# Patient Record
Sex: Female | Born: 1970 | Race: White | Hispanic: No | Marital: Married | Smoking: Never smoker
Health system: Southern US, Community
[De-identification: ages and names within clinical notes are randomized; demographics above are authoritative.]

## PROBLEM LIST (undated history)

## (undated) DIAGNOSIS — K219 Gastro-esophageal reflux disease without esophagitis: Secondary | ICD-10-CM

## (undated) DIAGNOSIS — F419 Anxiety disorder, unspecified: Secondary | ICD-10-CM

## (undated) DIAGNOSIS — F32A Depression, unspecified: Secondary | ICD-10-CM

## (undated) DIAGNOSIS — E782 Mixed hyperlipidemia: Secondary | ICD-10-CM

## (undated) DIAGNOSIS — I10 Essential (primary) hypertension: Secondary | ICD-10-CM

## (undated) DIAGNOSIS — R918 Other nonspecific abnormal finding of lung field: Secondary | ICD-10-CM

## (undated) DIAGNOSIS — K589 Irritable bowel syndrome without diarrhea: Secondary | ICD-10-CM

## (undated) DIAGNOSIS — F329 Major depressive disorder, single episode, unspecified: Secondary | ICD-10-CM

## (undated) DIAGNOSIS — M1990 Unspecified osteoarthritis, unspecified site: Secondary | ICD-10-CM

## (undated) DIAGNOSIS — E785 Hyperlipidemia, unspecified: Secondary | ICD-10-CM

## (undated) DIAGNOSIS — Z8249 Family history of ischemic heart disease and other diseases of the circulatory system: Secondary | ICD-10-CM

## (undated) DIAGNOSIS — O4400 Placenta previa specified as without hemorrhage, unspecified trimester: Secondary | ICD-10-CM

## (undated) HISTORY — DX: Gastro-esophageal reflux disease without esophagitis: K21.9

## (undated) HISTORY — DX: Family history of ischemic heart disease and other diseases of the circulatory system: Z82.49

## (undated) HISTORY — DX: Complete placenta previa nos or without hemorrhage, unspecified trimester: O44.00

## (undated) HISTORY — DX: Depression, unspecified: F32.A

## (undated) HISTORY — DX: Anxiety disorder, unspecified: F41.9

## (undated) HISTORY — DX: Other nonspecific abnormal finding of lung field: R91.8

## (undated) HISTORY — DX: Essential (primary) hypertension: I10

## (undated) HISTORY — DX: Irritable bowel syndrome, unspecified: K58.9

## (undated) HISTORY — DX: Unspecified osteoarthritis, unspecified site: M19.90

## (undated) HISTORY — DX: Mixed hyperlipidemia: E78.2

## (undated) HISTORY — DX: Hyperlipidemia, unspecified: E78.5

## (undated) HISTORY — DX: Morbid (severe) obesity due to excess calories: E66.01

---
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
## 1999-05-18 ENCOUNTER — Other Ambulatory Visit: Admission: RE | Admit: 1999-05-18 | Discharge: 1999-05-18 | Payer: Self-pay | Admitting: Obstetrics and Gynecology

## 1999-09-06 ENCOUNTER — Other Ambulatory Visit: Admission: RE | Admit: 1999-09-06 | Discharge: 1999-09-06 | Payer: Self-pay | Admitting: Obstetrics & Gynecology

## 2000-03-14 ENCOUNTER — Inpatient Hospital Stay (HOSPITAL_COMMUNITY): Admission: AD | Admit: 2000-03-14 | Discharge: 2000-03-16 | Payer: Self-pay | Admitting: Obstetrics & Gynecology

## 2000-04-12 ENCOUNTER — Other Ambulatory Visit: Admission: RE | Admit: 2000-04-12 | Discharge: 2000-04-12 | Payer: Self-pay | Admitting: Obstetrics & Gynecology

## 2001-07-20 ENCOUNTER — Other Ambulatory Visit: Admission: RE | Admit: 2001-07-20 | Discharge: 2001-07-20 | Payer: Self-pay | Admitting: Obstetrics & Gynecology

## 2002-06-13 ENCOUNTER — Other Ambulatory Visit: Admission: RE | Admit: 2002-06-13 | Discharge: 2002-06-13 | Payer: Self-pay | Admitting: Obstetrics & Gynecology

## 2002-06-28 HISTORY — PX: DILATION AND CURETTAGE OF UTERUS: SHX78

## 2002-07-06 ENCOUNTER — Ambulatory Visit (HOSPITAL_COMMUNITY): Admission: RE | Admit: 2002-07-06 | Discharge: 2002-07-06 | Payer: Self-pay | Admitting: Obstetrics & Gynecology

## 2002-07-06 ENCOUNTER — Encounter (INDEPENDENT_AMBULATORY_CARE_PROVIDER_SITE_OTHER): Payer: Self-pay | Admitting: Specialist

## 2003-06-18 ENCOUNTER — Other Ambulatory Visit: Admission: RE | Admit: 2003-06-18 | Discharge: 2003-06-18 | Payer: Self-pay | Admitting: Obstetrics & Gynecology

## 2003-10-05 ENCOUNTER — Inpatient Hospital Stay (HOSPITAL_COMMUNITY): Admission: AD | Admit: 2003-10-05 | Discharge: 2003-10-15 | Payer: Self-pay | Admitting: Obstetrics & Gynecology

## 2003-11-01 ENCOUNTER — Inpatient Hospital Stay (HOSPITAL_COMMUNITY): Admission: AD | Admit: 2003-11-01 | Discharge: 2003-11-01 | Payer: Self-pay | Admitting: Obstetrics & Gynecology

## 2003-12-22 ENCOUNTER — Inpatient Hospital Stay (HOSPITAL_COMMUNITY): Admission: AD | Admit: 2003-12-22 | Discharge: 2003-12-26 | Payer: Self-pay | Admitting: *Deleted

## 2003-12-24 ENCOUNTER — Encounter (INDEPENDENT_AMBULATORY_CARE_PROVIDER_SITE_OTHER): Payer: Self-pay | Admitting: Specialist

## 2003-12-27 ENCOUNTER — Encounter: Admission: RE | Admit: 2003-12-27 | Discharge: 2004-01-26 | Payer: Self-pay | Admitting: Obstetrics & Gynecology

## 2004-01-30 ENCOUNTER — Other Ambulatory Visit: Admission: RE | Admit: 2004-01-30 | Discharge: 2004-01-30 | Payer: Self-pay | Admitting: Obstetrics & Gynecology

## 2004-12-07 ENCOUNTER — Ambulatory Visit: Payer: Self-pay | Admitting: Family Medicine

## 2005-01-02 IMAGING — US US OB COMP +14 WK
1 series · 17 of 28 positions shown · non-contrast
Comparison: none

CLINICAL DATA: 24 weeks estimated gestational age with a history of placenta previa and vaginal bleeding

[Series 1: us ob comp +14 wk · 17 of 58 slices shown]
[im 1/58]
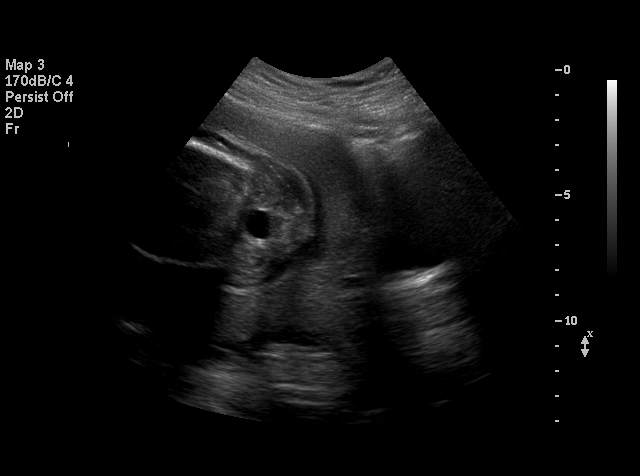
[im 5/58]
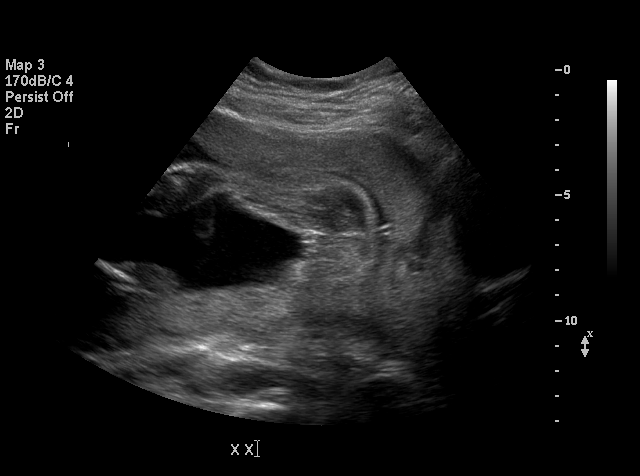
[im 9/58]
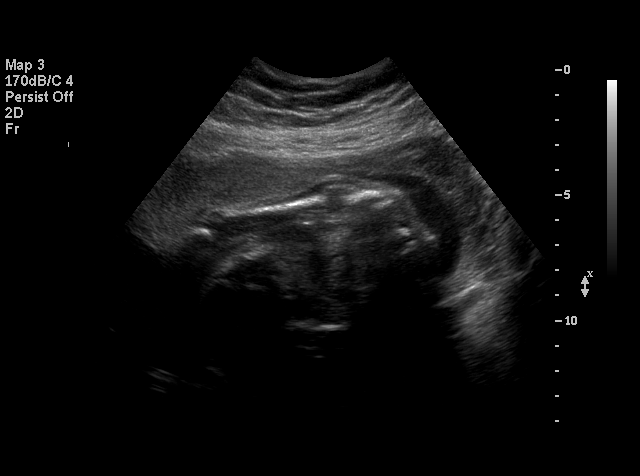
[im 11/58]
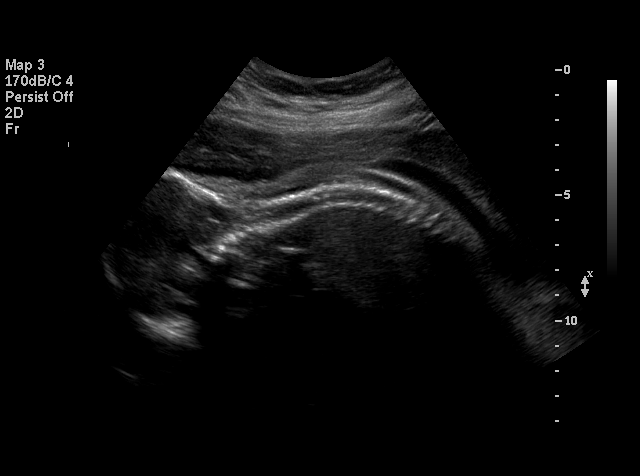
[im 15/58]
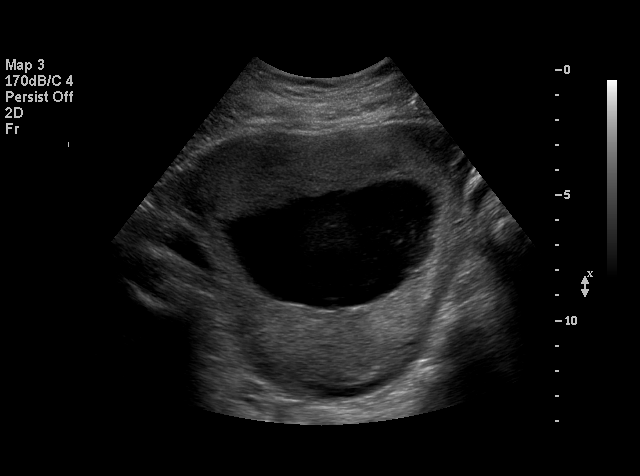
[im 20/58]
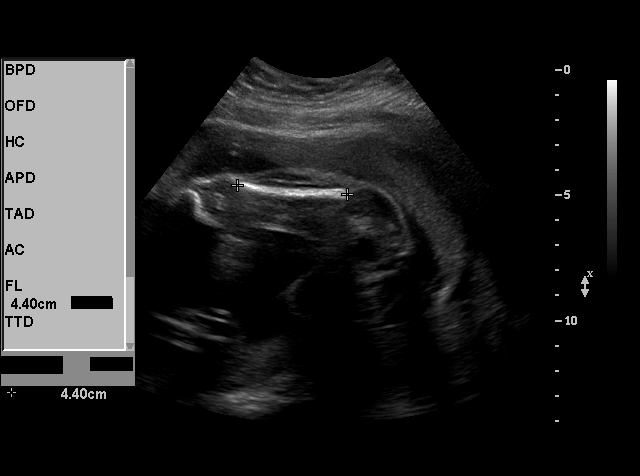
[im 22/58]
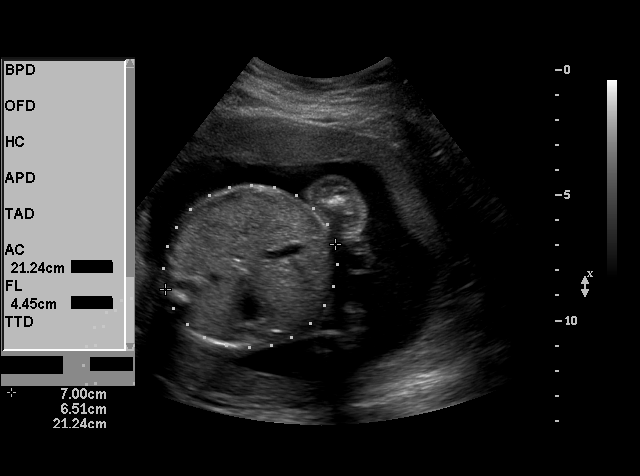
[im 26/58]
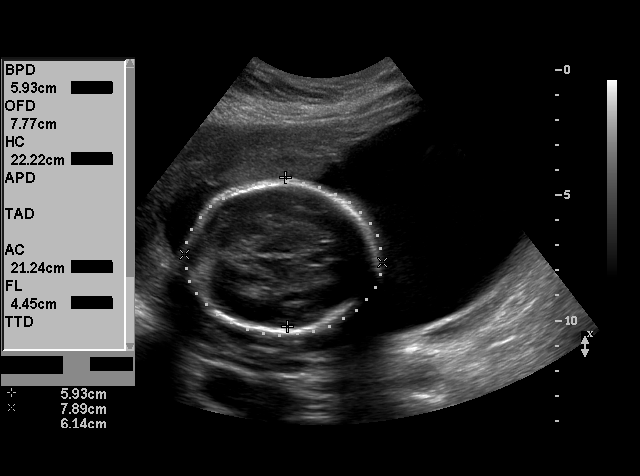
[im 30/58]
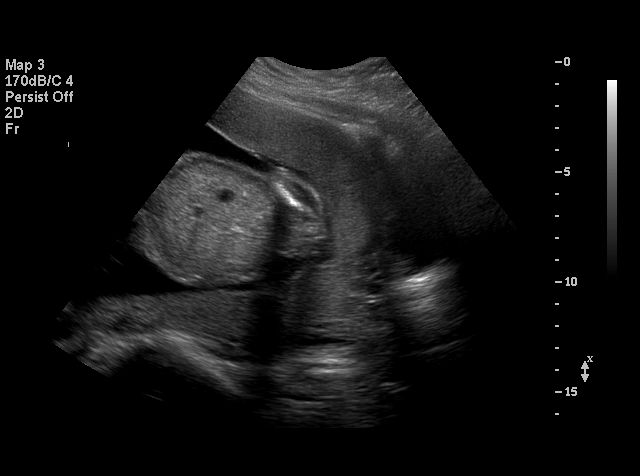
[im 32/58]
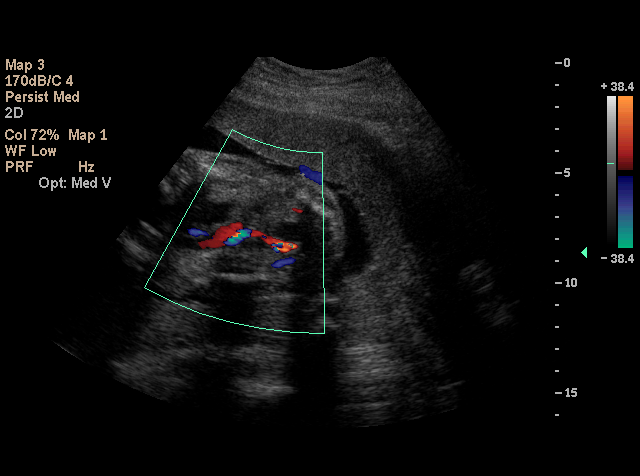
[im 36/58]
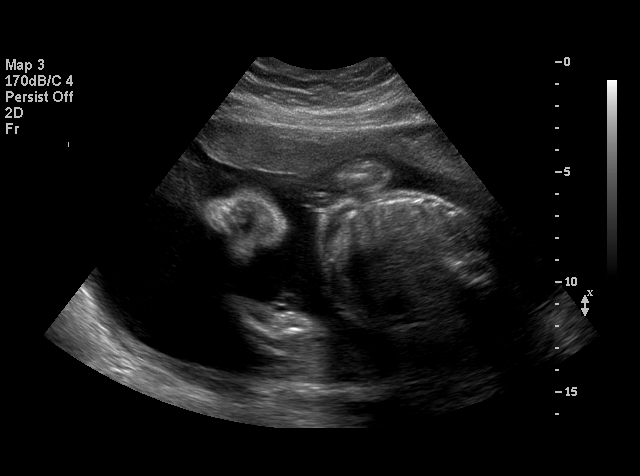
[im 39/58]
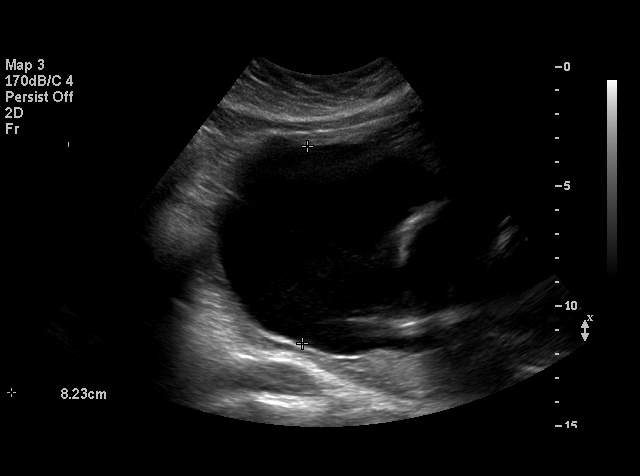
[im 43/58]
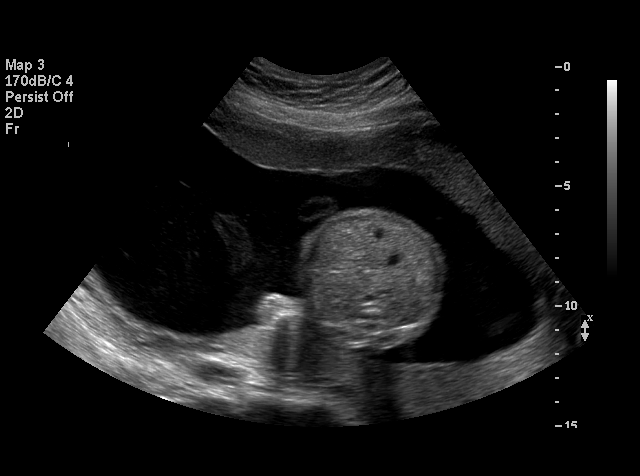
[im 47/58]
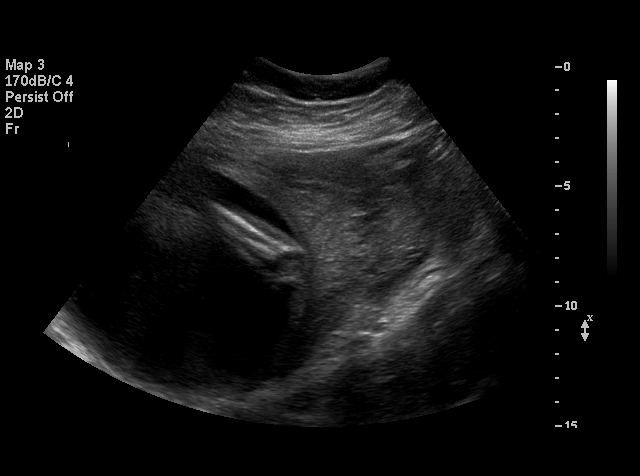
[im 49/58]
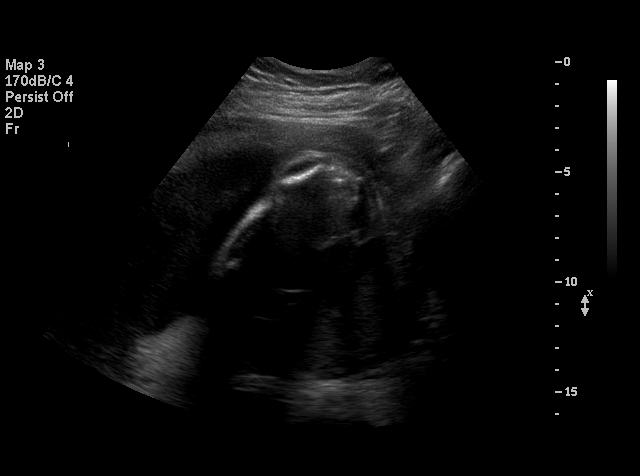
[im 53/58]
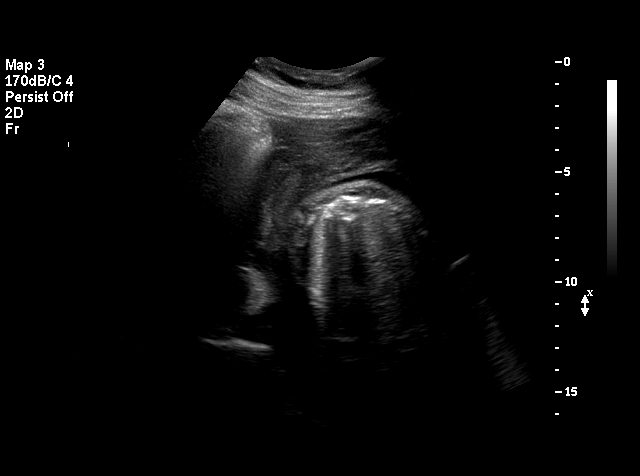
[im 58/58]
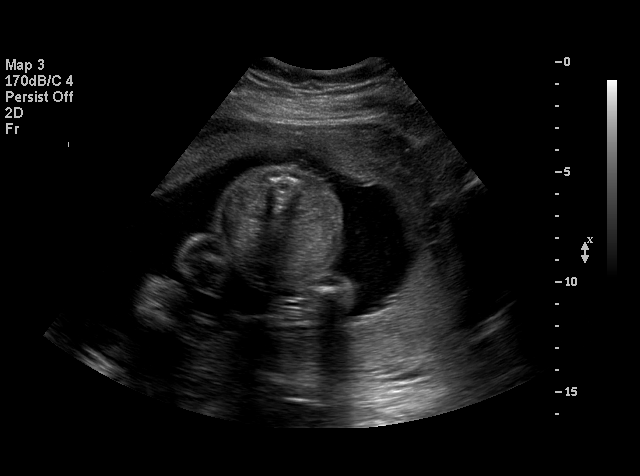

[17 of 28 positions shown; findings below may reference images not displayed]

OBSTETRICAL ULTRASOUND

NUMBER OF FETUSES:  1
HEART RATE: 162
MOVEMENT:  Yes
BREATHING:  Yes  
PRESENTATION:  Breech
PLACENTAL LOCATION:  Anterior and posterior
GRADE:  I
PREVIA:  Complete
AMNIOTIC FLUID (SUBJECTIVE):  Normal.
AMNIOTIC FLUID (OBJECTIVE):   8.2 cm vertical pocket
FETAL BIOMETRY
BPD:  5.9 cm  24W  1D
HC:  22.3 cm  24W  3D
AC:  21.3 cm  25W  6D  
FL:  4.4 cm  24W  5D
MEAN GA:      24W  6D
Fetal indices are within normal limits.

FETAL ANATOMY
LATERAL VENTRICLES:  visualized   
THALAMI/CSP:    visualized     
POSTERIOR FOSSA:  visualized   
NUCHAL REGION:  N/A  
SPINE:  visualized except for longitudinal views of the thoracic spine  
4 CHAMBER HEART ON LEFT:  visualized     
STOMACH ON LEFT:  visualized   
3 VESSEL CORD:  2 vessel cord  
CORD INSERTION SITE:  visualized   
KIDNEYS:  visualized   
BLADDER:  visualized   
EXTREMITIES:  visualized     

ADDITIONAL ANATOMY VISUALIZED:  RVOT, upper lip, orbits, profile, diaphragm, heel, ductal arch.
COMMENT:  The sonographer noted at her real time exam that there was a membrane located in the region of the fundus of the uterus.  It was unclear at her real time exam whether this represented a synechiae or possibly an amniotic band.  
MATERNAL FINDINGS
CERVIX: 
3.4 cm transabdominally.
IMPRESSION 

single intrauterine pregnancy demonstrating an estimated gestational age by ultrasound of 24 weeks 6 days.  Correlatin with assigned gestational age by LMP of 24 weeks 1 day suggests appropriate growth.
Subjectively and quantitatively normal amniotic fluid volume.
Current symmetric complete placenta previa.  No evidence for retroplacental, preplacental, or marginal hemorrhage is seen in this patient with a history of vaginal bleeding.
A two-vessel cord is felt to be present.  No other focal fetal abnormalities are noted on the images provided.  However, a complete anatomic exam was not possible with the fifth digit, full views of the heart and spine not accomplished.  Also, the sonographer suggested the presence of a membrane in the fundal region of the uterus near the fetal head.  It was unclear at her real time exam whether this could represent a synechiae or possibly an amniotic band and it is unclear from the images provided.  Rescanning to allow real time assessment by a radiologist would be recommended.
This study was performed as an on call procedure and was initially reviewed by Doctor Premila Weiland.  As this study was performed as an on call procedure, real time assessment by a radiologist was not performed.

## 2005-01-28 IMAGING — US US RETROPERITONEAL COMPLETE
1 series · 18 of 22 positions shown · non-contrast
Comparison: none

CLINICAL DATA: 32-year-old female at 28 weeks gestation with right flank pain.
 RENAL ULTRASOUND
 Real time multiplanar gray-scale ultrasonography of the abdomen was performed.  The right kidney measures 14.5 cm in long axis.  There is mild to moderate hydronephrosis on the right side.  Renal parenchymal echotexture is borderline increased in the right kidney.  The left kidney measures 13.1 cm in long axis.  There is mild fullness of the intrarenal collecting system on the left. 
 Midline imaging in the anatomic pelvis reveals the urinary bladder.  No ureteral jets could be identified secondary to motion in the adjacent fetus.
 IMPRESSION 
 Mild to moderate right-sided hydronephrosis.
 Fullness of the left intrarenal collecting system.
 [REDACTED]

[Series 1: us renal/aorta · 18 of 22 slices shown]
[im 1/22]
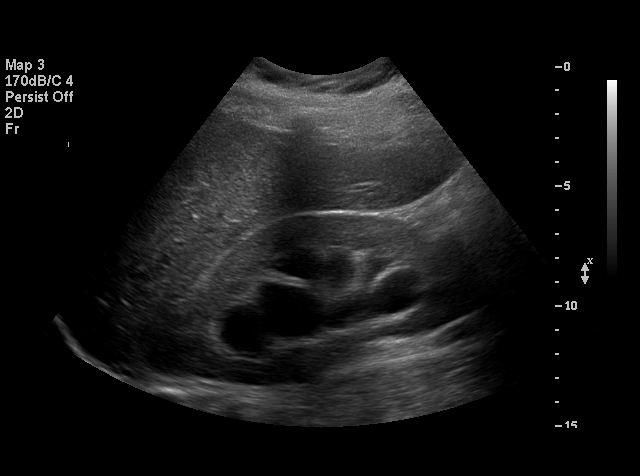
[im 2/22]
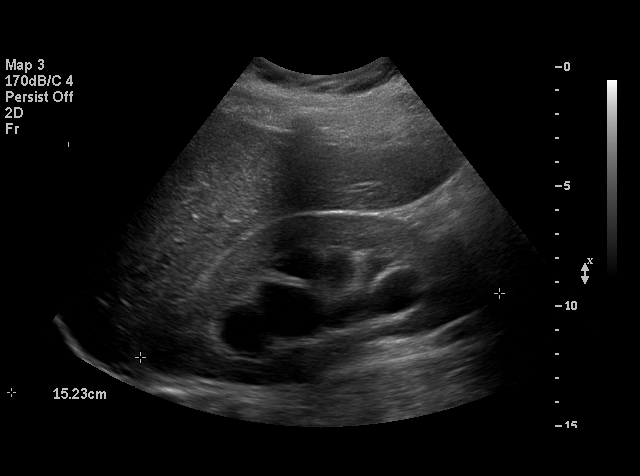
[im 4/22]
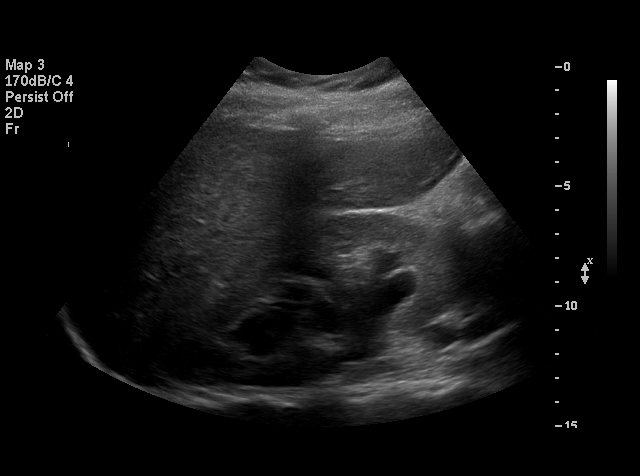
[im 5/22]
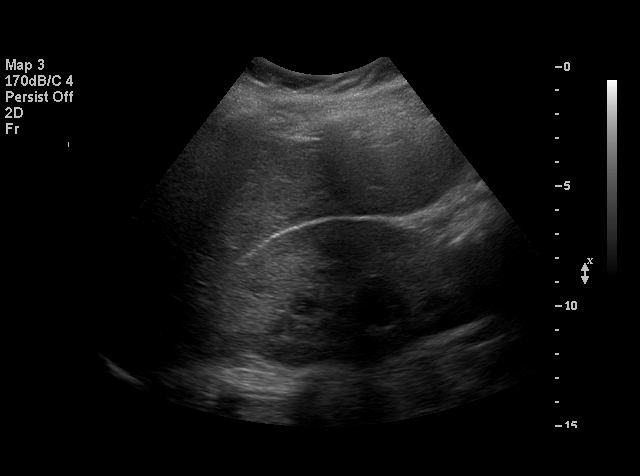
[im 6/22]
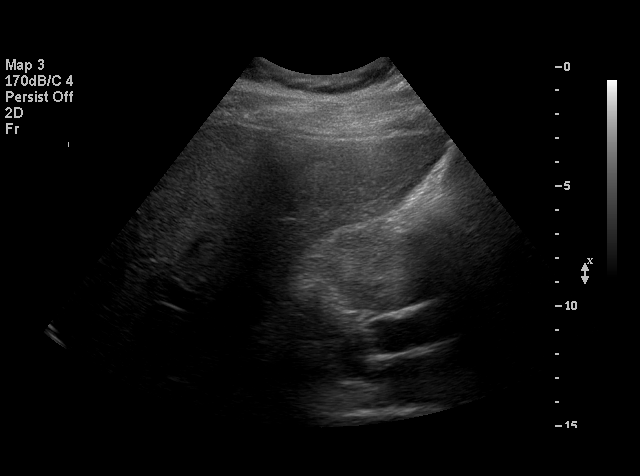
[im 7/22]
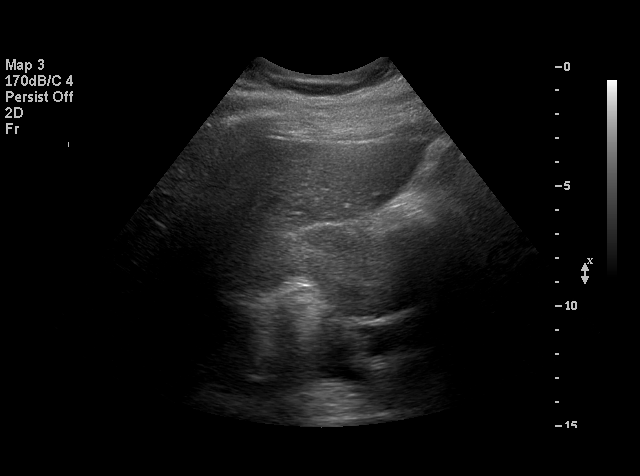
[im 8/22]
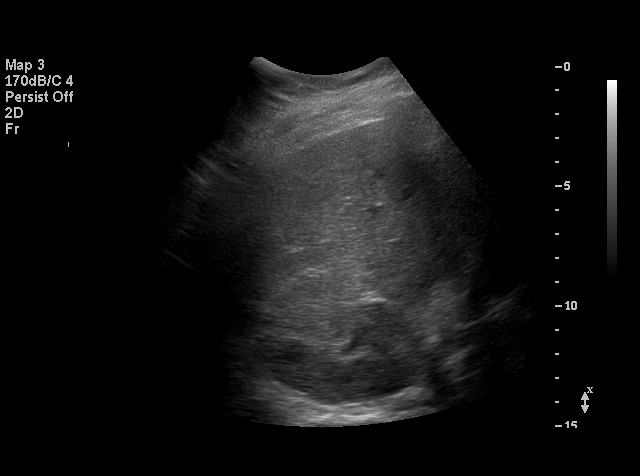
[im 10/22]
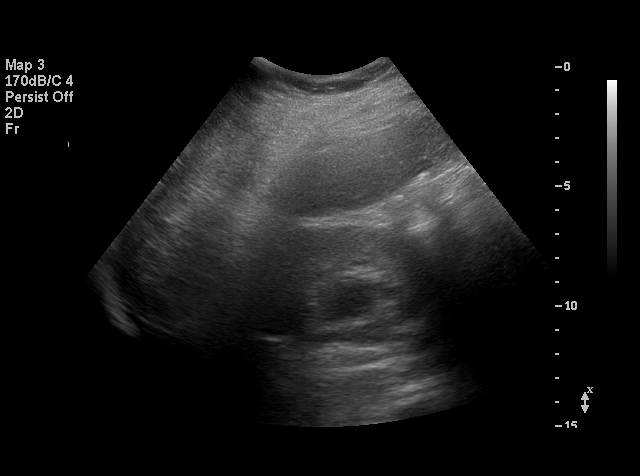
[im 11/22]
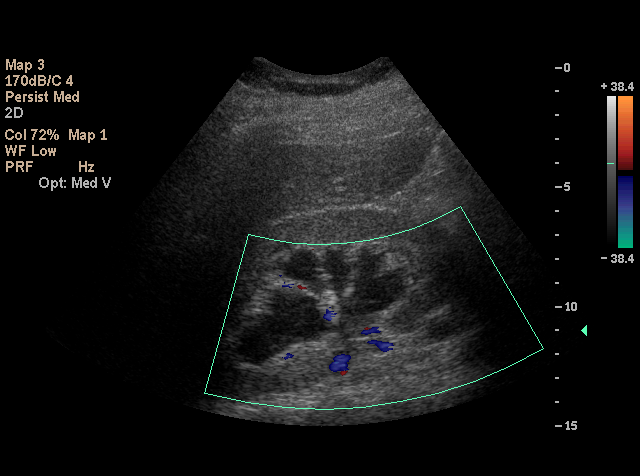
[im 12/22]
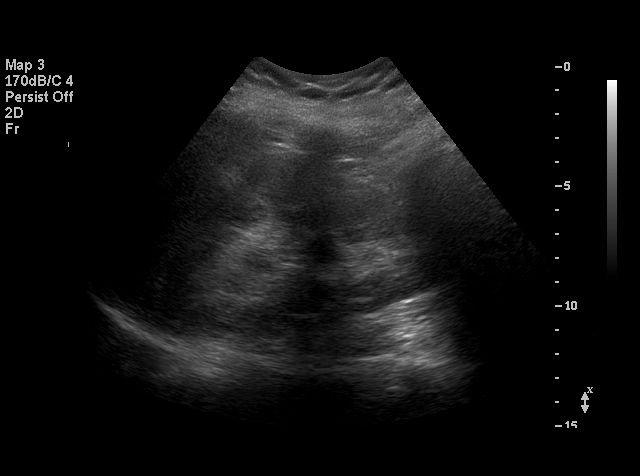
[im 13/22]
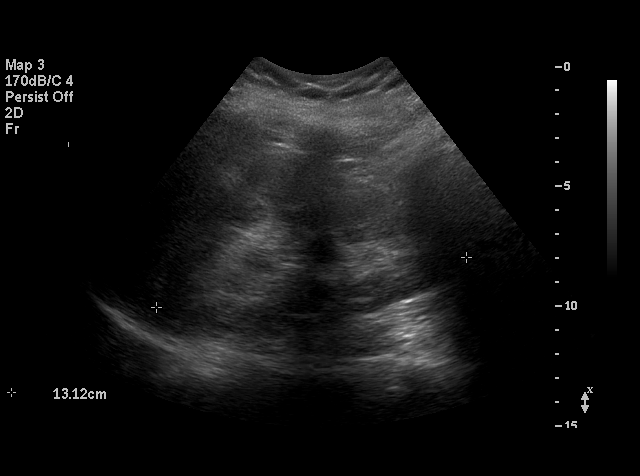
[im 15/22]
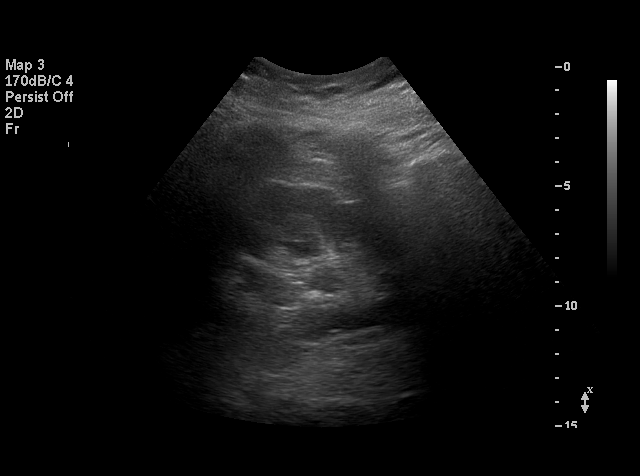
[im 16/22]
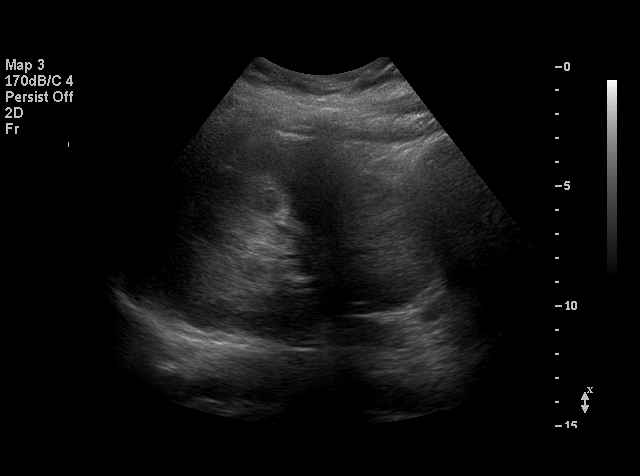
[im 17/22]
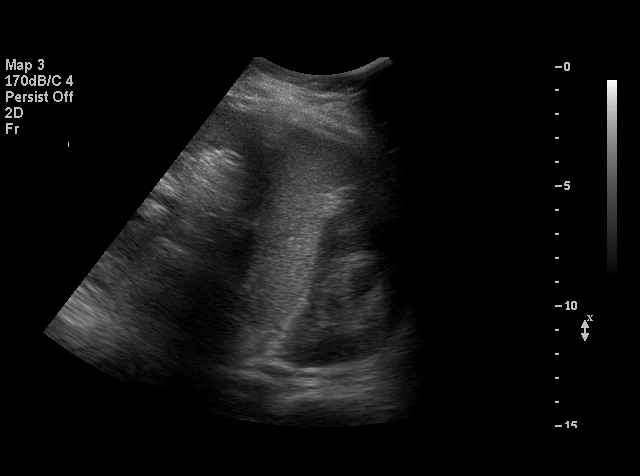
[im 18/22]
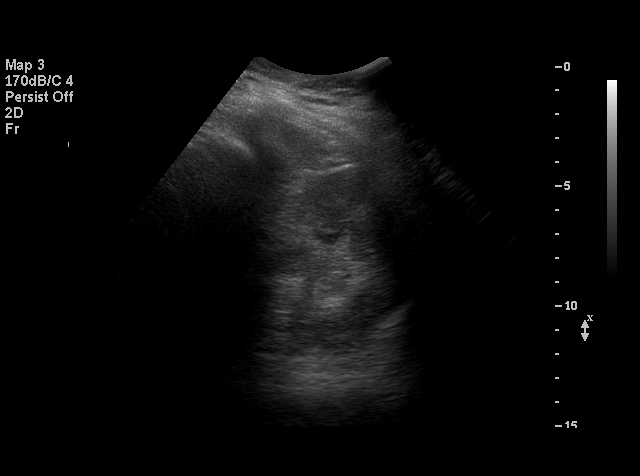
[im 19/22]
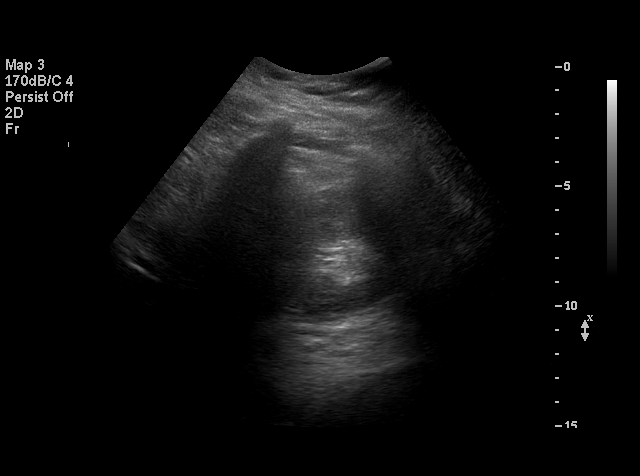
[im 21/22]
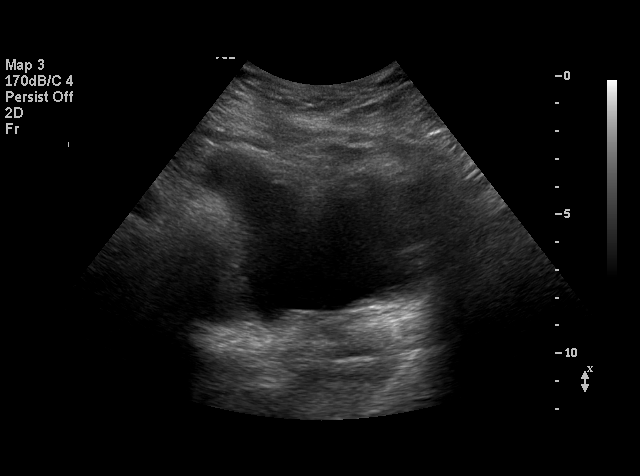
[im 22/22]
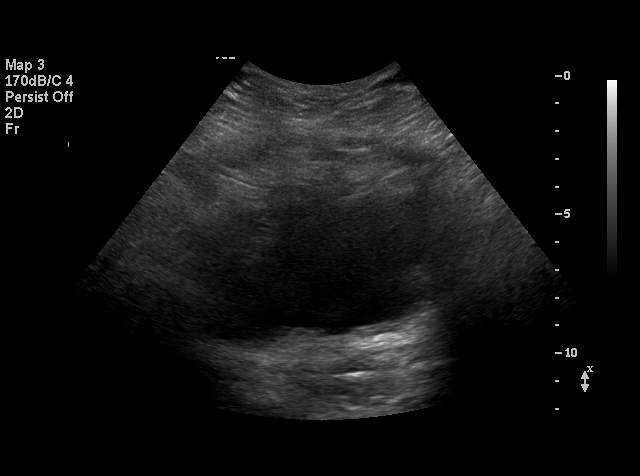

[18 of 22 positions shown; findings below may reference images not displayed]

## 2005-02-23 ENCOUNTER — Inpatient Hospital Stay (HOSPITAL_COMMUNITY): Admission: AD | Admit: 2005-02-23 | Discharge: 2005-02-23 | Payer: Self-pay | Admitting: Obstetrics & Gynecology

## 2005-03-29 ENCOUNTER — Inpatient Hospital Stay (HOSPITAL_COMMUNITY): Admission: AD | Admit: 2005-03-29 | Discharge: 2005-04-01 | Payer: Self-pay | Admitting: Obstetrics and Gynecology

## 2005-12-29 ENCOUNTER — Ambulatory Visit: Payer: Self-pay | Admitting: Family Medicine

## 2006-12-29 ENCOUNTER — Encounter: Payer: Self-pay | Admitting: Family Medicine

## 2006-12-29 LAB — CONVERTED CEMR LAB: Pap Smear: NORMAL

## 2007-03-07 ENCOUNTER — Ambulatory Visit: Payer: Self-pay | Admitting: Family Medicine

## 2007-03-07 LAB — CONVERTED CEMR LAB
ALT: 19 units/L (ref 0–40)
AST: 19 units/L (ref 0–37)
Albumin: 3.9 g/dL (ref 3.5–5.2)
Alkaline Phosphatase: 46 units/L (ref 39–117)
BUN: 12 mg/dL (ref 6–23)
Bilirubin, Direct: 0.1 mg/dL (ref 0.0–0.3)
CO2: 29 meq/L (ref 19–32)
Calcium: 9 mg/dL (ref 8.4–10.5)
Chloride: 108 meq/L (ref 96–112)
Cholesterol: 183 mg/dL (ref 0–200)
Creatinine, Ser: 0.5 mg/dL (ref 0.4–1.2)
GFR calc Af Amer: 180 mL/min
GFR calc non Af Amer: 148 mL/min
Glucose, Bld: 87 mg/dL (ref 70–99)
HDL: 49.2 mg/dL (ref >39.0)
LDL Cholesterol: 121 mg/dL — ABNORMAL HIGH (ref 0–99)
Potassium: 3.8 meq/L (ref 3.5–5.1)
Sodium: 140 meq/L (ref 135–145)
TSH: 0.89 microintl units/mL (ref 0.35–5.50)
Total Bilirubin: 0.6 mg/dL (ref 0.3–1.2)
Total CHOL/HDL Ratio: 3.7
Total Protein: 7.3 g/dL (ref 6.0–8.3)
Triglycerides: 64 mg/dL (ref 0–149)
VLDL: 13 mg/dL (ref 0–40)

## 2007-03-15 ENCOUNTER — Encounter: Payer: Self-pay | Admitting: Family Medicine

## 2007-03-15 DIAGNOSIS — E785 Hyperlipidemia, unspecified: Secondary | ICD-10-CM | POA: Insufficient documentation

## 2007-03-16 ENCOUNTER — Encounter: Payer: Self-pay | Admitting: Family Medicine

## 2007-03-16 ENCOUNTER — Ambulatory Visit: Payer: Self-pay | Admitting: Family Medicine

## 2008-07-21 ENCOUNTER — Ambulatory Visit: Payer: Self-pay | Admitting: Internal Medicine

## 2008-07-21 DIAGNOSIS — M25579 Pain in unspecified ankle and joints of unspecified foot: Secondary | ICD-10-CM | POA: Insufficient documentation

## 2009-08-18 ENCOUNTER — Encounter: Payer: Self-pay | Admitting: Family Medicine

## 2010-07-06 ENCOUNTER — Encounter (INDEPENDENT_AMBULATORY_CARE_PROVIDER_SITE_OTHER): Payer: Self-pay | Admitting: *Deleted

## 2010-11-26 ENCOUNTER — Ambulatory Visit
Admission: RE | Admit: 2010-11-26 | Discharge: 2010-11-26 | Payer: Self-pay | Source: Home / Self Care | Attending: Internal Medicine | Admitting: Internal Medicine

## 2010-11-26 DIAGNOSIS — J069 Acute upper respiratory infection, unspecified: Secondary | ICD-10-CM | POA: Insufficient documentation

## 2010-12-28 NOTE — Letter (Signed)
Summary: Nadara Eaton letter  [place] [facility]  [address] [place], [place] [address]   Phone: [phone]  Fax: [phone]       07/06/2010 MRN: [id number]  ELAN MCELVAIN [address] [place], [place]  [address]  Dear Ms. Dan Humphreys,  [facility] [place], and [facility] announce the retirement of Arta Silence, M.D., from full-time practice at the [facility] office effective May 27, 2010 and his plans of returning part-time.  It is important to Dr. Hetty Ely and to our practice that you understand that [facility] has seven physicians in our office for your health care needs.  We will continue to offer the same exceptional care that you have today.    Dr. Hetty Ely has spoken to many of you about his plans for retirement and returning part-time in the fall.   We will continue to work with you through the transition to schedule appointments for you in the office and meet the high standards that [facility] is committed to.   Again, it is with great pleasure that we share the news that Dr. Hetty Ely will return to [facility] in October of 2011 with a reduced schedule.    If you have any questions, or would like to request an appointment with one of our physicians, please call us at [phone] and press the option for Scheduling an appointment.  We take pleasure in providing you with excellent patient care and look forward to seeing you at your next office visit.  Our [facility] Physicians are:  Tillman Abide, M.D. Laurita Quint, M.D. Roxy Manns, M.D. Kerby Nora, M.D. Hannah Beat, M.D. Ruthe Mannan, M.D. We proudly welcomed Raechel Ache, M.D. and Eustaquio Boyden, M.D. to the practice in July/August 2011.  Sincerely,   [facility]

## 2010-12-30 NOTE — Assessment & Plan Note (Signed)
Summary: SINUS INFECTION / LFW   Vital Signs:  Patient profile:   40 year old female Height:      63 inches Weight:      165.75 pounds BMI:     29.47 Temp:     98.4 degrees F oral Pulse rate:   68 / minute Pulse rhythm:   regular BP sitting:   136 / 90  (left arm) Cuff size:   regular  Vitals Entered By: Selena Batten Dance CMA Duncan Dull) (November 26, 2010 11:09 AM) CC: ? Sinus infection   History of Present Illness: CC: headahce  5d h/o frontal sinus pressure headache.  2d ago better, then yesterday sinus pressure much worse.  has tried tylenol sinus and nasal saline drops but not really helping.  Worse with bending head forward.  + mild purulent nasal discharge started today.  No tooth pain or ear pain.  + ear pressure R side.  + eyes watering.  No ST, RN.  + congestion.  No cough, abd pain, n/v/d, f/c, rashes, myalgias, arthralgias.  + kids sick at home.  No smokers at home.  No asthma.  No h/o allergies.  recent new bird but got Monday, started Sunday.  BP up today, last up 2009 as well.  advised to keep track of bp at store, if >140/90 consistently, return for eval.  Current Medications (verified): 1)  Zegerid Otc 20-1100 Mg Caps (Omeprazole-Sodium Bicarbonate) .Marland Kitchen.. 1 By Mouth Once Daily  Allergies (verified): No Known Drug Allergies  Past History:  Past Medical History: Last updated: 03/15/2007 Hyperlipidemia  Social History: Last updated: 03/15/2007 Marital Status: MarriedLIVES WITH HUSBAND Children: 3 CHILDREN Occupation: HOUSEWIFE Never Smoked Alcohol use-yes Drug use-no  Review of Systems       per HPI  Physical Exam  General:  alert and normal appearance.   Head:  Normocephalic and atraumatic without obvious abnormalities. No apparent alopecia or balding.  no sinus tenderness Eyes:  PERRLA, EOMI, no injection Ears:  TMs clear bilaterally Nose:  nares clear bilaterally Mouth:  no pharyngeal erythema, mild PND Neck:  No deformities, masses, or tenderness  noted.  no LAD Lungs:  Normal respiratory effort, chest expands symmetrically. Lungs are clear to auscultation, no crackles or wheezes. Heart:  Normal rate and regular rhythm. S1 and S2 normal without gallop, murmur, click, rub or other extra sounds. Pulses:  2+ rad pulses, brisk cap refill Extremities:  no c/c/e   Impression & Recommendations:  Problem # 1:  VIRAL URI (ICD-465.9) vs early sinusitis.  going on for 5 days.  supportive care for now.  update next week if no better with measures outlined in pt instructions as well as inhalation of vapor/steam.  Complete Medication List: 1)  Zegerid Otc 20-1100 Mg Caps (Omeprazole-sodium bicarbonate) .Marland Kitchen.. 1 by mouth once daily  Patient Instructions: 1)  You have an early sinus infection or just viral infection. 2)  If not better into next week, update [place]. 3)  Take guaifenesin 400mg  IR 1 1/2 pills in am and at noon with plenty of fluid to help mobilize mucous. 4)  Use nasal saline spray to help drainage of sinuses or breathing hot vapor. 5)  Continue decongestant with tylenol. 6)  If you start having fevers >101.5, trouble swallowing or breathing, or are worsening instead of improving as expected, you may need to be seen again. 7)  Good to see you today, call clinic with questions.    Orders Added: 1)  Est. Patient Level III [[address]]  Current Allergies (reviewed today): No known allergies

## 2011-01-25 ENCOUNTER — Other Ambulatory Visit: Payer: Self-pay | Admitting: Dermatology

## 2011-04-15 NOTE — Consult Note (Signed)
NAMEMCKINZE, POIRIER NO.:  [id number]   MEDICAL RECORD NO.:  [id number]          PATIENT TYPE:  MAT   LOCATION:  MATC                          [facility]:  [facility]   PHYSICIAN:  Lenoard Aden, M.D.DATE OF BIRTH:  [birth year]   DATE OF CONSULTATION:  02/23/2005  DATE OF DISCHARGE:                                   CONSULTATION   CHIEF COMPLAINT:  Fever, nausea, vomiting, and diarrhea.   HISTORY OF PRESENT ILLNESS:  A 40 year old white female, G4, P1, at [redacted] weeks  gestation, who presents with a low-grade temperature and gastroenteritis  type symptoms over the last 24 hours.  The cramping and diarrhea has  markedly improved today.  The nausea has abated.   ALLERGIES:  No known drug allergies.   MEDICATIONS:  Prenatal vitamins.   OBSTETRIC HISTORY:  1.  Remarkable for a spontaneous vaginal delivery in 2001.  2.  SAB with D&C in 2003.  3.  Primary cesarean section for placenta previa in 2005.   GYNECOLOGIC HISTORY:  Remarkable for HPV.   No other medical or surgical hospitalizations.   FAMILY HISTORY:  Urolithiasis, insulin-dependent diabetes, and heart  disease.   PRENATAL LABORATORY DATA:  Blood type AB negative.  Rh negative.  Rubella  immune.  Hepatitis and HIV negative.  One hour GTT reportedly within normal  limits.  Pregnancy course to date uncomplicated.  A 20-week ultrasound  consistent with an appropriate-growing fetus.  Anterior placenta.  No  evidence of placenta previa.   PHYSICAL EXAMINATION:  GENERAL:  She is a well-developed, well-nourished  white female in no acute distress.  HEENT:  Normal.  LUNGS:  Clear.  HEART:  Regular rate and rhythm.  ABDOMEN:  Soft, gravid, and nontender.  No right upper quadrant tenderness.  EXTREMITIES:  No cords.  DTRs 2+.  No evidence of clonus.  PELVIC EXAM:  Deferred.  SKIN:  Intact.  NEUROLOGIC:  Nonfocal.   NST reveals fetal heart tones to be in the 150 beat per minute range with  good long-term  variability, but no evidence of accelerations or  decelerations.  No evidence of contractions at this time.  CBC and  urinalysis are pending.   IMPRESSION:  1.  Probable viral gastroenteritis.  2.  Thirty-four week intrauterine pregnancy.  3.  Previous cesarean section with previous successful vaginal delivery.   PLAN:  Obtain reactive NST.  Check CBC and UA.  Consider BPP if NST  nonreactive.  Aggressive IV fluid hydration.  Will discharge home upon  reassuring fetal status and reassuring maternal status.      RJT/MEDQ  D:  02/23/2005  T:  02/23/2005  Job:  [id number]   cc:   Ma Hillock

## 2011-04-15 NOTE — Discharge Summary (Signed)
NAME:  Deanna Schultz, Deanna Schultz                        ACCOUNT NO.:  [id number]   MEDICAL RECORD NO.:  [id number]                   PATIENT TYPE:  INP   LOCATION:  9152                                 [facility]ILITY:  [facility]   PHYSICIAN:  Genia Del, M.D.             DATE OF BIRTH:  01-14-1971   DATE OF ADMISSION:  10/05/2003  DATE OF DISCHARGE:  10/15/2003                                 DISCHARGE SUMMARY   ADMISSION DIAGNOSES:  A 23-week 5-day gestation with late second trimester  vaginal bleeding on a complete placenta previa.   DISCHARGE DIAGNOSES:  A 23-week 5-day gestation with resolved vaginal  bleeding on a complete placenta previa.   HOSPITAL COURSE:  [name] is a 40 year old, G3, P1, A1, who presented at  23 weeks 5 days gestation with a first episode of vaginal bleeding. She  denied abdominopelvic pain and had no fluid leak, no uterine contractions  felt, and good fetal movement felt. No PIH symptoms. She is known for a  complete placenta previa by ultrasound at 20 weeks and is 23+ weeks. She was  only on prenatal vitamins.  No known drug allergies and her first pregnancy  was normal in April 2001 at 37 week; she had a spontaneous vaginal delivery.  Otherwise, pregnancy was progressing normally. During hospitalization the  patient had no further vaginal bleeding. She just finished passing brownish  discharge times about two days after admission. There were no uterine  contractions, no preterm cervical change. On ultrasound her cervix was 3.4  cm and flexed. The patient received betamethasone times two doses. She met  with NICU.  Given her RH negative status, she received RhoGAM.  On  ultrasound we knew that the umbilical cord had only two vessels. This was of  concern in the hospital and estimated fetal weight corresponded well to 24+  weeks.  The review of anatomy was within normal limits and _________ seen  that was present on the two ultrasounds done at [facility].  The  amniotic fluid was normal. Fetal heart rate monitoring was reassuring.   Given that the patient was completely stable with resolved vaginal bleeding,  the decision was taken to discharge her home on complete bedrest on October 15, 2003. She will be followed closely in the office and ultrasounds will be  done to follow fetal growth given the two vessel cord. The patient was  informed of the risks of increased bleeding with each anecdote on placenta  previa and she knows that a C-section would have to be done if the placenta  remains complete previa at the time of delivery.   DISCHARGE INSTRUCTIONS:  She will call back for vaginal bleeding, decreased  fetal movements, and more uterine contractions.  Genia Del, M.D.    ML/MEDQ  D:  11/06/2003  T:  11/06/2003  Job:  [id number]

## 2011-04-15 NOTE — Discharge Summary (Signed)
NAME:  Deanna Schultz, DHAMI                        ACCOUNT NO.:  [id number]   MEDICAL RECORD NO.:  [id number]                   PATIENT TYPE:  INP   LOCATION:  9113                                 [facility]   PHYSICIAN:  Genia Del, M.D.             DATE OF BIRTH:  03/16/1971   DATE OF ADMISSION:  12/22/2003  DATE OF DISCHARGE:  12/26/2003                                 DISCHARGE SUMMARY   ADMISSION DIAGNOSIS:  A 35+ week complete placenta previa with third  trimester bleeding, fetal lung maturity reached.   DISCHARGE DIAGNOSIS:  A 35+ week complete placenta previa with third  trimester bleeding, fetal lung maturity reached with delivery by C section  of a baby girl with Apgars 9 and 9.   INTERVENTION:  Low transverse primary cesarean section on December 24, 2003,  under spinal anesthesia.   HOSPITAL COURSE:  The patient had a primary elective low transverse cesarean  section on December 24, 2003.  A baby girl was born with Apgars of 9 and 9.  The hystero tummy was closed in two planes.  The estimated blood loss was  750 cc.  No complication occurred.  The postop evaluation was unremarkable  except for hemoglobin at 8.3 which was well tolerated by the patient.  She  was discharged home on postop day #2 on December 26, 2003.  She will have her  staples removed on January 31 at __________ OB/GYN office.  Postop advise  was given.  Tylox p.r.n. was prescribed and SlowFe p.o. b.i.d. was  recommended.  The patient left the hospital in good status.  She will follow  up at __________ OB/GYN in four weeks.                                               Genia Del, M.D.    ML/MEDQ  D:  02/02/2004  T:  02/03/2004  Job:  [id number]

## 2011-04-15 NOTE — Op Note (Signed)
   NAME:  Deanna Schultz, Deanna Schultz                        ACCOUNT NO.:  [id number]   MEDICAL RECORD NO.:  [id number]                   PATIENT TYPE:  AMB   LOCATION:  SDC                                  [facility]   PHYSICIAN:  Genia Del, M.D.             DATE OF BIRTH:  04/09/1971   DATE OF PROCEDURE:  07/06/2002  DATE OF DISCHARGE:                                 OPERATIVE REPORT   PREOPERATIVE DIAGNOSES:  Blighted ovum at 7+ weeks.   POSTOPERATIVE DIAGNOSES:  Blighted ovum at 7+ weeks.   INTERVENTION:  Dilatation and evacuation.   SURGEON:  Genia Del, M.D.   ANESTHESIOLOGIST:  Raul Del, M.D.   PROCEDURE:  Under MAC analgesia, the patient was in the lithotomy position.  She was prepped with Betadine in the suprapubic vulvar and vaginal areas and  draped as usual.  The bladder was emptied with a catheter.  The vaginal exam  reveals a retroverted uterus, corresponding to about 8 weeks' gestation.  The cervix is closed and long.  No active bleeding, no adnexal mass.  The  speculum was introduced.  The entire cervical block is done with lidocaine  1% plain, 20 cc total, at 4 and 8 o'clock.  The tenaculum is put in place on  the anterior lip of the cervix.  Dilatation is done easily with Hegar  dilators up to #31.  A #9 curved curette is used for suction.  Products of  conception corresponding to 7-8 weeks' gestation are removed and sent to  pathology. The sharp curette is used to curettage the intrauterine cavity  systematically on all surfaces.  The uterine sound is heard all over.  The  suction curette is removed once more to remove all left products of  conception or blood clots.  The uterus is contracting well.  The instruments  are removed, and no active bleeding is present.  The estimated blood loss  was less than 50 cc.  No complications occurred, and the patient was  transferred to the recovery room in good status.  The blood group is AB-,  but the  patient received RhoGAM already on July 05, 2002, at [facility]  office.                                               Genia Del, M.D.    ML/MEDQ  D:  07/06/2002  T:  07/07/2002  Job:  [id number]

## 2011-04-15 NOTE — H&P (Signed)
[facility]  Patient:    Deanna Schultz, Deanna Schultz                     MRN: [id number] Adm. Date:  [id number] Attending:  Lenoard Aden                         History and Physical  CHIEF COMPLAINT:                  Spontaneous rupture of membranes at 0130. today.  HISTORY OF PRESENT ILLNESS:       The patient is a 40 year old white female, G1, P0, EDD of Apr 04, 2000, at 37 weeks who presents with questionable leakage of fluid and active labor.  PAST OBSTETRICAL HISTORY:         Noncontributory. ALLERGIES:                        No known drug allergies.  MEDICATIONS:                      Prenatal vitamins.  FAMILY HISTORY:                   Hypertension, adult onset diabetes.  HOSPITALIZATIONS:                 No other hospitalizations.  SOCIAL HISTORY:                   Uncomplicated.  The patient denies domestic or physical violence.  PRENATAL LABORATORY DATA:         Blood type AB positive, Rh antibody negative,  VDRL nonreactive, rubella immune, hepatitis B surface antigen negative, HIV nonreactive.  Group B Strep negative.  PHYSICAL EXAMINATION:  GENERAL:                          The patient is a well-developed, well-nourished white female in no apparent distress.  HEENT:                            Normal.  LUNGS:                            Clear.  HEART:                            Regular rate and rhythm.  ABDOMEN:                          Soft, gravid, nontender. PELVIC:                           Cervical exam is 4 to 5 cm, 90%, vertex, +1.  Clear fluid noted.  EXTREMITIES:                      No cords.  NEUROLOGIC:                       Nonfocal.  IMPRESSION:                       Term intrauterine pregnancy in active labor.  PLAN:                             Anticipate vaginal delivery. DD:  03/14/00 TD:  03/14/00 Job: [id number]

## 2011-04-15 NOTE — Op Note (Signed)
NAME:  Deanna Schultz, Deanna Schultz                        ACCOUNT NO.:  [id number]   MEDICAL RECORD NO.:  [id number]                   PATIENT TYPE:  INP   LOCATION:  9113                                 FACILITY:  [facility]   PHYSICIAN:  Genia Del, M.D.             DATE OF BIRTH:  1971/11/02   DATE OF PROCEDURE:  12/24/2003  DATE OF DISCHARGE:                                 OPERATIVE REPORT   PREOPERATIVE DIAGNOSES:  1. Thirty-five plus weeks' gestation.  2. Fetal lung maturity positive.  3. Complete placenta previa with third trimester bleeding.   POSTOPERATIVE DIAGNOSES:  1. Thirty-five plus weeks' gestation.  2. Fetal lung maturity positive.  3. Complete placenta previa with third trimester bleeding.   INTERVENTION:  Primary elective low transverse cesarean section.   SURGEON:  Genia Del, M.D.   ASSISTANT:  Richardean Sale, M.D.   ANESTHESIOLOGIST:  Burnett Corrente, M.D.   DESCRIPTION OF PROCEDURE:  Under spinal anesthesia the patient is in a 15  degree left decubitus position.  She is prepped with Hibiclens on the  abdominal, suprapubic, vulvar, and vaginal areas, and draped as usual.  The  bladder catheter is inserted.  We make a Pfannenstiel incision with a  scalpel after infiltration of the subcutaneous tissue with __________ 0.25%  20 mL.  We open the aponeurosis transversely with the electrocautery and the  Mayo scissors and detach the recti muscles from the aponeurosis on the  midline.  We then open the parietal peritoneum longitudinally with a  Metzenbaum scissors.  We then put the bladder retractor in place.  The  visceral peritoneum is opened transversely with the Metzenbaum scissors over  the lower uterine segment.  The bladder is reclined downward and the bladder  retractor is repositioned.  We then open a low transverse hysterotomy with  scalpel and prolong it on each side with the scissors.  We are over placenta  at that level.  We __________ with the hand  and reach an area of no placenta  superiorly, anteriorly, and to the left.  We reach the fetal head that way  and bring it to the incision.  The birth of a baby girl at 7:48, the cord is  clamped and cut, the baby is given to the neonatal team, Apgars are 9 and 9.  The placenta is complete previa, more anterior than posterior, no accreta.  It is evacuated completely.  The cord has two vessels.  The uterine revision  is done.  Pitocin is started in the IV fluid.  The uterus contracts well.  We also give Ancef IV.  Both ovaries and both tubes are normal in appearance  and size.  We close the hysterotomy in a first locked running suture of 0  Vicryl and a second layer in a mattress fashion is done with 0 Vicryl.  An X  stitch is done for hemostasis over the right angle.  Hemostasis is then  adequate.  We reposition the uterus inside the abdominopelvic cavity.  We  irrigate and suction the abdominopelvic cavity.  Hemostasis is good at the  level of the hysterotomy at the level of the bladder flap.  It is then  verified on the recti muscles and aponeurosis and completed with the  electrocautery.  We then close the aponeurosis in two half-running sutures  of 0 Vicryl.  We then complete hemostasis at the adipose tissue with the  electrocautery and reapproximate the skin with staples.  A  dry dressing is applied.  The count of sponges and instruments was complete  x2.  The estimated blood loss was 750 mL.  No complication occurred, and the  patient was transferred to recovery room in good status.  Her blood group is  AB positive.  She will receive the RhoGAM.  Rubella immune.                                               Genia Del, M.D.    ML/MEDQ  D:  12/24/2003  T:  12/24/2003  Job:  [id number]

## 2011-07-30 LAB — HM PAP SMEAR

## 2012-02-10 ENCOUNTER — Ambulatory Visit (INDEPENDENT_AMBULATORY_CARE_PROVIDER_SITE_OTHER): Payer: 59 | Admitting: Internal Medicine

## 2012-02-10 ENCOUNTER — Encounter: Payer: Self-pay | Admitting: Internal Medicine

## 2012-02-10 VITALS — BP 110/62 | HR 100 | Temp 98.2°F | Ht 63.0 in | Wt 168.0 lb

## 2012-02-10 DIAGNOSIS — R140 Abdominal distension (gaseous): Secondary | ICD-10-CM

## 2012-02-10 DIAGNOSIS — K581 Irritable bowel syndrome with constipation: Secondary | ICD-10-CM | POA: Insufficient documentation

## 2012-02-10 DIAGNOSIS — E785 Hyperlipidemia, unspecified: Secondary | ICD-10-CM

## 2012-02-10 DIAGNOSIS — R142 Eructation: Secondary | ICD-10-CM

## 2012-02-10 DIAGNOSIS — R141 Gas pain: Secondary | ICD-10-CM

## 2012-02-10 MED ORDER — OMEPRAZOLE-SODIUM BICARBONATE 20-1100 MG PO CAPS: 1.0000 | ORAL_CAPSULE | Freq: Every day | ORAL | Status: AC

## 2012-02-10 NOTE — Progress Notes (Signed)
Subjective:    Patient ID: Deanna Schultz, female    DOB: 1971/09/19, 41 y.o.   MRN: [id number]  HPI 41YO female with h/o hyperlipidemia presents to establish care. Primary concern today is several month h/o abdominal bloating and distension.  Mild epigastric pain also noted. Symptoms are worse in the afternoon.  Pt notes increased belching. Denies constipation, nausea, vomiting, diarrhea.  No fever, chills. No change in bowel habits, black stools, blood in stools. No change in weight noted.  Symptoms seem worse with intake of dairy products, so pt eliminated intake, however symptoms persistent.  Outpatient Encounter Prescriptions as of 02/10/2012  Medication Sig Dispense Refill  . Ascorbic Acid (VITAMIN C) 1000 MG tablet Take 1,000 mg by mouth daily.      . calcium carbonate (OS-CAL) 600 MG TABS Take 600 mg by mouth 2 (two) times daily.      . cholecalciferol (VITAMIN D) 1000 UNITS tablet Take 1,000 Units by mouth daily.      . Cyanocobalamin (B-12) 500 MCG TABS Take 1 tablet by mouth daily.      . Omega-3 Fatty Acids (FISH OIL) 1000 MG CAPS Take 3 capsules by mouth daily.      Maxwell Caul Bicarbonate (ZEGERID) 20-1100 MG CAPS Take 1 capsule by mouth daily before breakfast.  28 each  3  . DISCONTD: Omeprazole-Sodium Bicarbonate (ZEGERID) 20-1100 MG CAPS Take 1 capsule by mouth daily before breakfast.        Review of Systems  Constitutional: Negative for fever, chills, appetite change, fatigue and unexpected weight change.  HENT: Negative for ear pain, congestion, sore throat, trouble swallowing, neck pain, voice change and sinus pressure.   Eyes: Negative for visual disturbance.  Respiratory: Negative for cough, shortness of breath, wheezing and stridor.   Cardiovascular: Negative for chest pain, palpitations and leg swelling.  Gastrointestinal: Positive for abdominal pain and abdominal distention. Negative for nausea, vomiting, diarrhea, constipation, blood in stool and anal  bleeding.  Genitourinary: Negative for dysuria and flank pain.  Musculoskeletal: Negative for myalgias, arthralgias and gait problem.  Skin: Negative for color change and rash.  Neurological: Negative for dizziness and headaches.  Hematological: Negative for adenopathy. Does not bruise/bleed easily.  Psychiatric/Behavioral: Negative for suicidal ideas, sleep disturbance and dysphoric mood. The patient is not nervous/anxious.        Objective:   Physical Exam  Constitutional: She is oriented to person, place, and time. She appears well-developed and well-nourished. No distress.  HENT:  Head: Normocephalic and atraumatic.  Right Ear: External ear normal.  Left Ear: External ear normal.  Nose: Nose normal.  Mouth/Throat: Oropharynx is clear and moist. No oropharyngeal exudate.  Eyes: Conjunctivae are normal. Pupils are equal, round, and reactive to light. Right eye exhibits no discharge. Left eye exhibits no discharge. No scleral icterus.  Neck: Normal range of motion. Neck supple. No tracheal deviation present. No thyromegaly present.  Cardiovascular: Normal rate, regular rhythm, normal heart sounds and intact distal pulses.  Exam reveals no gallop and no friction rub.   No murmur heard. Pulmonary/Chest: Effort normal and breath sounds normal. No respiratory distress. She has no wheezes. She has no rales. She exhibits no tenderness.  Abdominal: Soft. Bowel sounds are normal. She exhibits distension. She exhibits no mass. There is tenderness (mild epigatric area). There is no rebound and no guarding.  Musculoskeletal: Normal range of motion. She exhibits no edema and no tenderness.  Lymphadenopathy:    She has no cervical adenopathy.  Neurological: She is  alert and oriented to person, place, and time. No cranial nerve deficit. She exhibits normal muscle tone. Coordination normal.  Skin: Skin is warm and dry. No rash noted. She is not diaphoretic. No erythema. No pallor.  Psychiatric: She  has a normal mood and affect. Her behavior is normal. Judgment and thought content normal.          Assessment & Plan:

## 2012-02-10 NOTE — Assessment & Plan Note (Signed)
Several months h/o bloating. Exam is normal. Symptoms suggest lactose intolerance. Will send labs for lactose intolerance, H. Pylori, Celiac, CMP, CBC, TSH.  Will continue PPI. Follow up1 month.

## 2012-02-10 NOTE — Assessment & Plan Note (Signed)
Will check lipids with labs today.  

## 2012-02-16 ENCOUNTER — Telehealth: Payer: Self-pay | Admitting: *Deleted

## 2012-02-16 ENCOUNTER — Telehealth: Payer: Self-pay | Admitting: Internal Medicine

## 2012-02-16 NOTE — Telephone Encounter (Signed)
Labs including testing for celiac disease, lactose deficiency, blood counts, kidney and liver function was normal. H. Pylori stillpending.

## 2012-02-16 NOTE — Telephone Encounter (Signed)
Opened in error

## 2012-02-16 NOTE — Telephone Encounter (Signed)
Mychart mess sent

## 2012-03-14 ENCOUNTER — Ambulatory Visit (INDEPENDENT_AMBULATORY_CARE_PROVIDER_SITE_OTHER): Payer: 59 | Admitting: Internal Medicine

## 2012-03-14 ENCOUNTER — Encounter: Payer: Self-pay | Admitting: Internal Medicine

## 2012-03-14 VITALS — BP 128/72 | HR 80 | Temp 98.2°F | Wt 172.0 lb

## 2012-03-14 DIAGNOSIS — R140 Abdominal distension (gaseous): Secondary | ICD-10-CM

## 2012-03-14 DIAGNOSIS — K5900 Constipation, unspecified: Secondary | ICD-10-CM | POA: Insufficient documentation

## 2012-03-14 DIAGNOSIS — R141 Gas pain: Secondary | ICD-10-CM

## 2012-03-14 MED ORDER — LINACLOTIDE 145 MCG PO CAPS: 145.0000 ug | ORAL_CAPSULE | Freq: Every day | ORAL | Status: DC

## 2012-03-14 NOTE — Assessment & Plan Note (Signed)
Symptoms are most consistent with irritable bowel syndrome with constipation. Will give trial of Linzess. We'll also set up referral to GI medicine for evaluation and possible upper endoscopy/colonoscopy. Pt will email or call with update later this week after starting Linzess.

## 2012-03-14 NOTE — Assessment & Plan Note (Signed)
Suspect related to constipation. As below, will add Linzess. Patient will call or e-mail with update. We'll also set up referral to GI medicine for further evaluation.

## 2012-03-14 NOTE — Progress Notes (Signed)
Subjective:    Patient ID: Deanna Schultz, female    DOB: 01/29/1971, 41 y.o.   MRN: [id number]  HPI 41 year old female with history of GERD and bloating presents for followup. Recent lab work performed was unrevealing, including CBC, CMP, and testing for H. pylori and lactose intolerance. She continues to have abdominal distention and bloating which is worse in the evening and associated with constipation. She reports passage of pebble-like stools on almost a daily basis. She denies any blood in her stool. She denies any abdominal pain. She denies any nausea or vomiting. She has tried making changes in her diet with no improvement in her symptoms.  Outpatient Encounter Prescriptions as of 03/14/2012  Medication Sig Dispense Refill  . Ascorbic Acid (VITAMIN C) 1000 MG tablet Take 1,000 mg by mouth daily.      . calcium carbonate (OS-CAL) 600 MG TABS Take 600 mg by mouth 2 (two) times daily.      . cholecalciferol (VITAMIN D) 1000 UNITS tablet Take 1,000 Units by mouth daily.      . Cyanocobalamin (B-12) 500 MCG TABS Take 1 tablet by mouth daily.      . Omega-3 Fatty Acids (FISH OIL) 1000 MG CAPS Take 3 capsules by mouth daily.      Maxwell Caul Bicarbonate (ZEGERID) 20-1100 MG CAPS Take 1 capsule by mouth daily before breakfast.  28 each  3  . Linaclotide (LINZESS) 145 MCG CAPS Take 145 mcg by mouth daily.  30 capsule  3    Review of Systems  Constitutional: Negative for fever, chills, appetite change, fatigue and unexpected weight change.  HENT: Negative for ear pain, congestion, sore throat, trouble swallowing, neck pain, voice change and sinus pressure.   Eyes: Negative for visual disturbance.  Respiratory: Negative for cough, shortness of breath, wheezing and stridor.   Cardiovascular: Negative for chest pain, palpitations and leg swelling.  Gastrointestinal: Positive for constipation and abdominal distention. Negative for nausea, vomiting, abdominal pain, diarrhea, blood in  stool and anal bleeding.  Genitourinary: Negative for dysuria and flank pain.  Musculoskeletal: Negative for myalgias, arthralgias and gait problem.  Skin: Negative for color change and rash.  Neurological: Negative for dizziness and headaches.  Hematological: Negative for adenopathy. Does not bruise/bleed easily.  Psychiatric/Behavioral: Negative for suicidal ideas, sleep disturbance and dysphoric mood. The patient is not nervous/anxious.    BP 128/72  Pulse 80  Temp(Src) 98.2 F (36.8 C) (Oral)  Wt 172 lb (78.019 kg)  SpO2 99%     Objective:   Physical Exam  Constitutional: She is oriented to person, place, and time. She appears well-developed and well-nourished. No distress.  HENT:  Head: Normocephalic and atraumatic.  Right Ear: External ear normal.  Left Ear: External ear normal.  Nose: Nose normal.  Mouth/Throat: Oropharynx is clear and moist. No oropharyngeal exudate.  Eyes: Conjunctivae are normal. Pupils are equal, round, and reactive to light. Right eye exhibits no discharge. Left eye exhibits no discharge. No scleral icterus.  Neck: Normal range of motion. Neck supple. No tracheal deviation present. No thyromegaly present.  Cardiovascular: Normal rate, regular rhythm, normal heart sounds and intact distal pulses.  Exam reveals no gallop and no friction rub.   No murmur heard. Pulmonary/Chest: Effort normal and breath sounds normal. No respiratory distress.  Abdominal: Soft. Bowel sounds are normal. She exhibits no distension. There is no tenderness.  Musculoskeletal: Normal range of motion. She exhibits no edema and no tenderness.  Lymphadenopathy:    She has no  cervical adenopathy.  Neurological: She is alert and oriented to person, place, and time. No cranial nerve deficit. She exhibits normal muscle tone. Coordination normal.  Skin: Skin is warm and dry. No rash noted. She is not diaphoretic. No erythema. No pallor.  Psychiatric: She has a normal mood and affect. Her  behavior is normal. Judgment and thought content normal.          Assessment & Plan:

## 2012-03-15 ENCOUNTER — Encounter: Payer: Self-pay | Admitting: Internal Medicine

## 2012-03-19 ENCOUNTER — Telehealth: Payer: Self-pay | Admitting: Internal Medicine

## 2012-03-19 DIAGNOSIS — R140 Abdominal distension (gaseous): Secondary | ICD-10-CM

## 2012-03-19 MED ORDER — LINACLOTIDE 145 MCG PO CAPS: 145.0000 ug | ORAL_CAPSULE | Freq: Every day | ORAL | Status: DC

## 2012-03-19 NOTE — Telephone Encounter (Signed)
Patient sent MyChart message requesting refill.  Rx has been sent, patient notified.

## 2012-07-23 ENCOUNTER — Other Ambulatory Visit: Payer: Self-pay | Admitting: Internal Medicine

## 2012-08-16 ENCOUNTER — Telehealth: Payer: Self-pay | Admitting: Internal Medicine

## 2012-08-16 NOTE — Telephone Encounter (Signed)
Prescription requires a prior autho PA required call [phone] linzess 145 mcg cap fore QTY 30

## 2012-08-27 ENCOUNTER — Encounter: Payer: Self-pay | Admitting: Internal Medicine

## 2012-08-29 ENCOUNTER — Telehealth: Payer: Self-pay | Admitting: Internal Medicine

## 2012-08-29 NOTE — Telephone Encounter (Signed)
[employer] is needing a prior authorization for the medication Linzees [phone].

## 2012-08-29 NOTE — Telephone Encounter (Signed)
See phone note from 08/29/2012.

## 2012-08-29 NOTE — Telephone Encounter (Signed)
PA form requested today.

## 2012-09-04 NOTE — Telephone Encounter (Signed)
Requested 2nd PA form.

## 2012-09-04 NOTE — Telephone Encounter (Signed)
Received PA form for Linzees, form completed and given to Dr. Dan Humphreys to sign.

## 2012-09-27 ENCOUNTER — Ambulatory Visit: Payer: 59 | Admitting: Internal Medicine

## 2012-10-22 ENCOUNTER — Encounter: Payer: Self-pay | Admitting: Internal Medicine

## 2012-11-16 ENCOUNTER — Other Ambulatory Visit: Payer: Self-pay | Admitting: Internal Medicine

## 2012-11-19 NOTE — Telephone Encounter (Signed)
Med filled.  

## 2012-12-17 ENCOUNTER — Ambulatory Visit (INDEPENDENT_AMBULATORY_CARE_PROVIDER_SITE_OTHER): Payer: 59 | Admitting: Internal Medicine

## 2012-12-17 ENCOUNTER — Encounter: Payer: Self-pay | Admitting: Internal Medicine

## 2012-12-17 ENCOUNTER — Telehealth: Payer: Self-pay | Admitting: Internal Medicine

## 2012-12-17 VITALS — BP 118/78 | HR 72 | Temp 98.6°F | Ht 63.0 in | Wt 175.0 lb

## 2012-12-17 DIAGNOSIS — K589 Irritable bowel syndrome without diarrhea: Secondary | ICD-10-CM

## 2012-12-17 DIAGNOSIS — E663 Overweight: Secondary | ICD-10-CM

## 2012-12-17 DIAGNOSIS — K581 Irritable bowel syndrome with constipation: Secondary | ICD-10-CM

## 2012-12-17 MED ORDER — PHENTERMINE HCL 37.5 MG PO CAPS: 37.5000 mg | ORAL_CAPSULE | ORAL | Status: DC

## 2012-12-17 MED ORDER — LINACLOTIDE 145 MCG PO CAPS: 145.0000 ug | ORAL_CAPSULE | Freq: Every day | ORAL | Status: DC

## 2012-12-17 NOTE — Progress Notes (Signed)
Subjective:    Patient ID: Deanna Schultz, female    DOB: 11-Mar-1971, 42 y.o.   MRN: [id number]  HPI 42 year old female with history of irritable bowel syndrome with constipation presents for followup. At her last visit, she was started on Linzess. She reports significant improvement in constipation and bloating on this medication. She reports that her appetite has improved and she is eating a more regular diet. She has been very physically active, exercising several days per week participating in spinning classes and yoga. However, she continues to gain some weight. She is concerned about this. She would like to get back on track with a goal for weight loss. She would like to get back to her ideal weight of approximately 140 pounds.  Outpatient Encounter Prescriptions as of 12/17/2012  Medication Sig Dispense Refill  . Ascorbic Acid (VITAMIN C) 1000 MG tablet Take 1,000 mg by mouth daily.      . calcium carbonate (OS-CAL) 600 MG TABS Take 600 mg by mouth 2 (two) times daily.      . cholecalciferol (VITAMIN D) 1000 UNITS tablet Take 1,000 Units by mouth daily.      . Cyanocobalamin (B-12) 500 MCG TABS Take 1 tablet by mouth daily.      . Linaclotide (LINZESS) 145 MCG CAPS Take 1 capsule (145 mcg total) by mouth daily.  30 capsule  11  . Omega-3 Fatty Acids (FISH OIL) 1000 MG CAPS Take 3 capsules by mouth daily.      Maxwell Caul Bicarbonate (ZEGERID) 20-1100 MG CAPS Take 1 capsule by mouth daily before breakfast.  28 each  3  . [DISCONTINUED] LINZESS 145 MCG CAPS TAKE ONE CAPSULE BY MOUTH ONE TIME DAILY  30 capsule  2  . phentermine 37.5 MG capsule Take 1 capsule (37.5 mg total) by mouth every morning.  30 capsule  0   BP 118/78  Pulse 72  Temp 98.6 F (37 C) (Oral)  Ht 5\' 3"  (1.6 m)  Wt 175 lb (79.379 kg)  BMI 31.00 kg/m2  SpO2 97%  LMP 12/14/2012  Review of Systems  Constitutional: Negative for fever, chills, appetite change, fatigue and unexpected weight change.  HENT:  Negative for ear pain, congestion, sore throat, trouble swallowing, neck pain, voice change and sinus pressure.   Eyes: Negative for visual disturbance.  Respiratory: Negative for cough, shortness of breath, wheezing and stridor.   Cardiovascular: Negative for chest pain, palpitations and leg swelling.  Gastrointestinal: Negative for nausea, vomiting, abdominal pain, diarrhea, constipation, blood in stool, abdominal distention and anal bleeding.  Genitourinary: Negative for dysuria and flank pain.  Musculoskeletal: Negative for myalgias, arthralgias and gait problem.  Skin: Negative for color change and rash.  Neurological: Negative for dizziness and headaches.  Hematological: Negative for adenopathy. Does not bruise/bleed easily.  Psychiatric/Behavioral: Negative for suicidal ideas, sleep disturbance and dysphoric mood. The patient is not nervous/anxious.        Objective:   Physical Exam  Constitutional: She is oriented to person, place, and time. She appears well-developed and well-nourished. No distress.  HENT:  Head: Normocephalic and atraumatic.  Right Ear: External ear normal.  Left Ear: External ear normal.  Nose: Nose normal.  Mouth/Throat: Oropharynx is clear and moist. No oropharyngeal exudate.  Eyes: Conjunctivae normal are normal. Pupils are equal, round, and reactive to light. Right eye exhibits no discharge. Left eye exhibits no discharge. No scleral icterus.  Neck: Normal range of motion. Neck supple. No tracheal deviation present. No thyromegaly present.  Cardiovascular:  Normal rate, regular rhythm, normal heart sounds and intact distal pulses.  Exam reveals no gallop and no friction rub.   No murmur heard. Pulmonary/Chest: Effort normal and breath sounds normal. No respiratory distress. She has no wheezes. She has no rales. She exhibits no tenderness.  Abdominal: Soft. Bowel sounds are normal. She exhibits no distension. There is no tenderness.  Musculoskeletal: Normal  range of motion. She exhibits no edema and no tenderness.  Lymphadenopathy:    She has no cervical adenopathy.  Neurological: She is alert and oriented to person, place, and time. No cranial nerve deficit. She exhibits normal muscle tone. Coordination normal.  Skin: Skin is warm and dry. No rash noted. She is not diaphoretic. No erythema. No pallor.  Psychiatric: She has a normal mood and affect. Her behavior is normal. Judgment and thought content normal.          Assessment & Plan:

## 2012-12-17 NOTE — Assessment & Plan Note (Signed)
BMI 31. We discussed keeping a food diary and limiting caloric intake. Also discussed eating foods with lower caloric density. Recommended a Mediterranean style diet. Encouraged her to continue efforts at regular physical activity. Will start phentermine to help with appetite suppression. We discussed potential risk and benefits of this medication. Will have her followup in 4 weeks for recheck of weight. Goal weight loss 0.5-1lb per week.

## 2012-12-17 NOTE — Assessment & Plan Note (Signed)
Symptoms are markedly improved with use of Linzess. Will continue.

## 2012-12-17 NOTE — Telephone Encounter (Signed)
LINZESS 145 MCG CAPS  #30  Needing Prior Authorization  # [id number]

## 2012-12-17 NOTE — Telephone Encounter (Signed)
Called [phone] for prior authorization form was faxed over 01.20.2014

## 2013-01-03 ENCOUNTER — Encounter: Payer: Self-pay | Admitting: Internal Medicine

## 2013-01-12 ENCOUNTER — Other Ambulatory Visit: Payer: Self-pay

## 2013-01-14 ENCOUNTER — Encounter: Payer: Self-pay | Admitting: Internal Medicine

## 2013-01-14 ENCOUNTER — Ambulatory Visit (INDEPENDENT_AMBULATORY_CARE_PROVIDER_SITE_OTHER): Payer: 59 | Admitting: Internal Medicine

## 2013-01-14 VITALS — BP 124/80 | HR 72 | Temp 98.2°F | Wt 171.0 lb

## 2013-01-14 DIAGNOSIS — K581 Irritable bowel syndrome with constipation: Secondary | ICD-10-CM

## 2013-01-14 DIAGNOSIS — E663 Overweight: Secondary | ICD-10-CM

## 2013-01-14 DIAGNOSIS — K589 Irritable bowel syndrome without diarrhea: Secondary | ICD-10-CM

## 2013-01-14 MED ORDER — PHENTERMINE HCL 37.5 MG PO CAPS: 37.5000 mg | ORAL_CAPSULE | ORAL | Status: DC

## 2013-01-14 NOTE — Assessment & Plan Note (Signed)
Congratulated pt on 4lb weight loss. Tolerating phentermine well. Will continue. Encouraged healthy diet and continued efforts at increased physical activity. Follow up 1 month.

## 2013-01-14 NOTE — Assessment & Plan Note (Signed)
Symptoms well controlled with Linzess. Will continue. 

## 2013-01-14 NOTE — Progress Notes (Signed)
Subjective:    Patient ID: Deanna Schultz, female    DOB: [birth year], 42 y.o.   MRN: [id number]  HPI 42YO female with h/o being overweight and IBS presents for follow up.  Overweight - Doing well. Following healthy diet. Exercising by adding running. Tolerating phentermine well with no noted side effects.  IBS - Symptoms well controlled with Linzess.  Outpatient Encounter Prescriptions as of 01/14/2013  Medication Sig Dispense Refill  . Ascorbic Acid (VITAMIN C) 1000 MG tablet Take 1,000 mg by mouth daily.      . calcium carbonate (OS-CAL) 600 MG TABS Take 600 mg by mouth 2 (two) times daily.      . cholecalciferol (VITAMIN D) 1000 UNITS tablet Take 1,000 Units by mouth daily.      . Cyanocobalamin (B-12) 500 MCG TABS Take 1 tablet by mouth daily.      . Linaclotide (LINZESS) 145 MCG CAPS Take 1 capsule (145 mcg total) by mouth daily.  30 capsule  11  . Omega-3 Fatty Acids (FISH OIL) 1000 MG CAPS Take 3 capsules by mouth daily.      Maxwell Caul Bicarbonate (ZEGERID) 20-1100 MG CAPS Take 1 capsule by mouth daily before breakfast.  28 each  3  . phentermine 37.5 MG capsule Take 1 capsule (37.5 mg total) by mouth every morning.  30 capsule  1  . [DISCONTINUED] phentermine 37.5 MG capsule Take 1 capsule (37.5 mg total) by mouth every morning.  30 capsule  0   No facility-administered encounter medications on file as of 01/14/2013.   BP 124/80  Pulse 72  Temp(Src) 98.2 F (36.8 C) (Oral)  Wt 171 lb (77.565 kg)  BMI 30.3 kg/m2  SpO2 98%  LMP 01/11/2013  Review of Systems  Constitutional: Negative for fever, chills, appetite change, fatigue and unexpected weight change.  HENT: Negative for ear pain, congestion, sore throat, trouble swallowing, neck pain, voice change and sinus pressure.   Eyes: Negative for visual disturbance.  Respiratory: Negative for cough, shortness of breath, wheezing and stridor.   Cardiovascular: Negative for chest pain, palpitations and leg swelling.   Gastrointestinal: Negative for nausea, vomiting, abdominal pain, diarrhea, constipation, blood in stool, abdominal distention and anal bleeding.  Genitourinary: Negative for dysuria and flank pain.  Musculoskeletal: Negative for myalgias, arthralgias and gait problem.  Skin: Negative for color change and rash.  Neurological: Negative for dizziness and headaches.  Hematological: Negative for adenopathy. Does not bruise/bleed easily.  Psychiatric/Behavioral: Negative for suicidal ideas, sleep disturbance and dysphoric mood. The patient is not nervous/anxious.        Objective:   Physical Exam  Constitutional: She is oriented to person, place, and time. She appears well-developed and well-nourished. No distress.  HENT:  Head: Normocephalic and atraumatic.  Right Ear: External ear normal.  Left Ear: External ear normal.  Nose: Nose normal.  Mouth/Throat: Oropharynx is clear and moist. No oropharyngeal exudate.  Eyes: Conjunctivae are normal. Pupils are equal, round, and reactive to light. Right eye exhibits no discharge. Left eye exhibits no discharge. No scleral icterus.  Neck: Normal range of motion. Neck supple. No tracheal deviation present. No thyromegaly present.  Cardiovascular: Normal rate, regular rhythm, normal heart sounds and intact distal pulses.  Exam reveals no gallop and no friction rub.   No murmur heard. Pulmonary/Chest: Effort normal and breath sounds normal. No respiratory distress. She has no wheezes. She has no rales. She exhibits no tenderness.  Musculoskeletal: Normal range of motion. She exhibits no edema and no  tenderness.  Lymphadenopathy:    She has no cervical adenopathy.  Neurological: She is alert and oriented to person, place, and time. No cranial nerve deficit. She exhibits normal muscle tone. Coordination normal.  Skin: Skin is warm and dry. No rash noted. She is not diaphoretic. No erythema. No pallor.  Psychiatric: She has a normal mood and affect. Her  behavior is normal. Judgment and thought content normal.          Assessment & Plan:

## 2013-01-16 ENCOUNTER — Telehealth: Payer: Self-pay | Admitting: *Deleted

## 2013-01-16 NOTE — Telephone Encounter (Signed)
Called [phone] for prior authorization for the Linzess 145 mcg, form is being faxed over now

## 2013-02-11 ENCOUNTER — Ambulatory Visit (INDEPENDENT_AMBULATORY_CARE_PROVIDER_SITE_OTHER): Payer: 59 | Admitting: Internal Medicine

## 2013-02-11 ENCOUNTER — Encounter: Payer: Self-pay | Admitting: Internal Medicine

## 2013-02-11 VITALS — BP 120/82 | HR 73 | Temp 98.3°F | Wt 169.0 lb

## 2013-02-11 DIAGNOSIS — E663 Overweight: Secondary | ICD-10-CM

## 2013-02-11 MED ORDER — PHENTERMINE HCL 37.5 MG PO CAPS: 37.5000 mg | ORAL_CAPSULE | ORAL | Status: DC

## 2013-02-11 NOTE — Progress Notes (Signed)
Subjective:    Patient ID: Deanna Schultz, female    DOB: 1971/11/27, 42 y.o.   MRN: [id number]  HPI 42YO female with h/o chronic constipation and obesity presents for follow up. Doing well. Notes some dietary indiscretion, but generally following healthy diet and following a walk/run program. Has lost another 2lbs. Continues to use phentermine to help with appetite suppression. No side effects noted from this med.  Outpatient Encounter Prescriptions as of 02/11/2013  Medication Sig Dispense Refill  . Ascorbic Acid (VITAMIN C) 1000 MG tablet Take 1,000 mg by mouth daily.      . calcium carbonate (OS-CAL) 600 MG TABS Take 600 mg by mouth 2 (two) times daily.      . cholecalciferol (VITAMIN D) 1000 UNITS tablet Take 1,000 Units by mouth daily.      . Cyanocobalamin (B-12) 500 MCG TABS Take 1 tablet by mouth daily.      . Linaclotide (LINZESS) 145 MCG CAPS Take 1 capsule (145 mcg total) by mouth daily.  30 capsule  11  . Omega-3 Fatty Acids (FISH OIL) 1000 MG CAPS Take 3 capsules by mouth daily.      Maxwell Caul Bicarbonate (ZEGERID) 20-1100 MG CAPS Take 1 capsule by mouth daily before breakfast.  28 each  3  . phentermine 37.5 MG capsule Take 1 capsule (37.5 mg total) by mouth every morning.  30 capsule  1  . [DISCONTINUED] phentermine 37.5 MG capsule Take 1 capsule (37.5 mg total) by mouth every morning.  30 capsule  1   No facility-administered encounter medications on file as of 02/11/2013.   BP 120/82  Pulse 73  Temp(Src) 98.3 F (36.8 C) (Oral)  Wt 169 lb (76.658 kg)  BMI 29.94 kg/m2  SpO2 98%  LMP 02/06/2013  Review of Systems  Constitutional: Negative for fever, chills, appetite change, fatigue and unexpected weight change.  HENT: Negative for ear pain, congestion, sore throat, trouble swallowing, neck pain, voice change and sinus pressure.   Eyes: Negative for visual disturbance.  Respiratory: Negative for cough, shortness of breath, wheezing and stridor.    Cardiovascular: Negative for chest pain, palpitations and leg swelling.  Gastrointestinal: Negative for nausea, vomiting, abdominal pain, diarrhea, constipation, blood in stool, abdominal distention and anal bleeding.  Genitourinary: Negative for dysuria and flank pain.  Musculoskeletal: Negative for myalgias, arthralgias and gait problem.  Skin: Negative for color change and rash.  Neurological: Negative for dizziness and headaches.  Hematological: Negative for adenopathy. Does not bruise/bleed easily.  Psychiatric/Behavioral: Negative for suicidal ideas, sleep disturbance and dysphoric mood. The patient is not nervous/anxious.        Objective:   Physical Exam  Constitutional: She is oriented to person, place, and time. She appears well-developed and well-nourished. No distress.  HENT:  Head: Normocephalic and atraumatic.  Right Ear: External ear normal.  Left Ear: External ear normal.  Nose: Nose normal.  Mouth/Throat: Oropharynx is clear and moist. No oropharyngeal exudate.  Eyes: Conjunctivae are normal. Pupils are equal, round, and reactive to light. Right eye exhibits no discharge. Left eye exhibits no discharge. No scleral icterus.  Neck: Normal range of motion. Neck supple. No tracheal deviation present. No thyromegaly present.  Cardiovascular: Normal rate, regular rhythm, normal heart sounds and intact distal pulses.  Exam reveals no gallop and no friction rub.   No murmur heard. Pulmonary/Chest: Effort normal and breath sounds normal. No respiratory distress. She has no wheezes. She has no rales. She exhibits no tenderness.  Musculoskeletal: Normal range  of motion. She exhibits no edema and no tenderness.  Lymphadenopathy:    She has no cervical adenopathy.  Neurological: She is alert and oriented to person, place, and time. No cranial nerve deficit. She exhibits normal muscle tone. Coordination normal.  Skin: Skin is warm and dry. No rash noted. She is not diaphoretic. No  erythema. No pallor.  Psychiatric: She has a normal mood and affect. Her behavior is normal. Judgment and thought content normal.          Assessment & Plan:

## 2013-02-11 NOTE — Assessment & Plan Note (Addendum)
Body mass index is 29.94 kg/(m^2).   Congratulated pt on 2lb weight loss. Tolerating phentermine well. Will continue. Encouraged healthy diet and continued efforts at increased physical activity. Follow up 1 month.

## 2013-03-15 ENCOUNTER — Encounter: Payer: Self-pay | Admitting: Internal Medicine

## 2013-03-26 ENCOUNTER — Encounter: Payer: Self-pay | Admitting: Internal Medicine

## 2013-03-26 ENCOUNTER — Ambulatory Visit (INDEPENDENT_AMBULATORY_CARE_PROVIDER_SITE_OTHER): Payer: 59 | Admitting: Internal Medicine

## 2013-03-26 VITALS — BP 122/72 | HR 76 | Temp 98.4°F | Wt 163.0 lb

## 2013-03-26 DIAGNOSIS — E663 Overweight: Secondary | ICD-10-CM

## 2013-03-26 MED ORDER — PHENTERMINE HCL 37.5 MG PO CAPS: 37.5000 mg | ORAL_CAPSULE | ORAL | Status: DC

## 2013-03-26 NOTE — Progress Notes (Signed)
Subjective:    Patient ID: Deanna Schultz, female    DOB: 1971/05/16, 42 y.o.   MRN: [id number]  HPI 41YO female presents for follow up. Following diet and exercise program with running/walking in effort to lose weight. Overall weight down 12lbs since 11/2012.  Taking phentermine for appetite suppression. No side effects noted.  Outpatient Encounter Prescriptions as of 03/26/2013  Medication Sig Dispense Refill  . Ascorbic Acid (VITAMIN C) 1000 MG tablet Take 1,000 mg by mouth daily.      . calcium carbonate (OS-CAL) 600 MG TABS Take 600 mg by mouth 2 (two) times daily.      . cholecalciferol (VITAMIN D) 1000 UNITS tablet Take 1,000 Units by mouth daily.      . Cyanocobalamin (B-12) 500 MCG TABS Take 1 tablet by mouth daily.      . Linaclotide (LINZESS) 145 MCG CAPS Take 1 capsule (145 mcg total) by mouth daily.  30 capsule  11  . Omega-3 Fatty Acids (FISH OIL) 1000 MG CAPS Take 3 capsules by mouth daily.      Maxwell Caul Bicarbonate (ZEGERID) 20-1100 MG CAPS Take 1 capsule by mouth daily before breakfast.  28 each  3  . phentermine 37.5 MG capsule Take 1 capsule (37.5 mg total) by mouth every morning.  30 capsule  1  . [DISCONTINUED] phentermine 37.5 MG capsule Take 1 capsule (37.5 mg total) by mouth every morning.  30 capsule  1   No facility-administered encounter medications on file as of 03/26/2013.   BP 122/72  Pulse 76  Temp(Src) 98.4 F (36.9 C) (Oral)  Wt 163 lb (73.936 kg)  BMI 28.88 kg/m2  SpO2 97%  LMP 03/06/2013  Review of Systems  Constitutional: Negative for fever, chills, appetite change, fatigue and unexpected weight change.  HENT: Negative for ear pain, congestion, sore throat, trouble swallowing, neck pain, voice change and sinus pressure.   Eyes: Negative for visual disturbance.  Respiratory: Negative for cough, shortness of breath, wheezing and stridor.   Cardiovascular: Negative for chest pain, palpitations and leg swelling.  Gastrointestinal:  Negative for nausea, vomiting, abdominal pain, diarrhea, constipation, blood in stool, abdominal distention and anal bleeding.  Genitourinary: Negative for dysuria and flank pain.  Musculoskeletal: Negative for myalgias, arthralgias and gait problem.  Skin: Negative for color change and rash.  Neurological: Negative for dizziness and headaches.  Hematological: Negative for adenopathy. Does not bruise/bleed easily.  Psychiatric/Behavioral: Negative for suicidal ideas, sleep disturbance and dysphoric mood. The patient is not nervous/anxious.        Objective:   Physical Exam  Constitutional: She is oriented to person, place, and time. She appears well-developed and well-nourished. No distress.  HENT:  Head: Normocephalic and atraumatic.  Right Ear: External ear normal.  Left Ear: External ear normal.  Nose: Nose normal.  Mouth/Throat: Oropharynx is clear and moist. No oropharyngeal exudate.  Eyes: Conjunctivae are normal. Pupils are equal, round, and reactive to light. Right eye exhibits no discharge. Left eye exhibits no discharge. No scleral icterus.  Neck: Normal range of motion. Neck supple. No tracheal deviation present. No thyromegaly present.  Cardiovascular: Normal rate, regular rhythm, normal heart sounds and intact distal pulses.  Exam reveals no gallop and no friction rub.   No murmur heard. Pulmonary/Chest: Effort normal and breath sounds normal. No respiratory distress. She has no wheezes. She has no rales. She exhibits no tenderness.  Musculoskeletal: Normal range of motion. She exhibits no edema and no tenderness.  Lymphadenopathy:  She has no cervical adenopathy.  Neurological: She is alert and oriented to person, place, and time. No cranial nerve deficit. She exhibits normal muscle tone. Coordination normal.  Skin: Skin is warm and dry. No rash noted. She is not diaphoretic. No erythema. No pallor.  Psychiatric: She has a normal mood and affect. Her behavior is normal.  Judgment and thought content normal.          Assessment & Plan:

## 2013-03-26 NOTE — Assessment & Plan Note (Signed)
Wt Readings from Last 3 Encounters:  03/26/13 163 lb (73.936 kg)  02/11/13 169 lb (76.658 kg)  01/14/13 171 lb (77.565 kg)   Congratulated pt on weight loss. Encouraged her to continue with healthy diet and regular physical activity. Will continue phentermine for appetite suppression. Follow up 1 month.

## 2013-04-09 ENCOUNTER — Telehealth: Payer: Self-pay | Admitting: *Deleted

## 2013-04-09 ENCOUNTER — Encounter: Payer: Self-pay | Admitting: Internal Medicine

## 2013-04-09 NOTE — Telephone Encounter (Signed)
Yes we do.

## 2013-04-09 NOTE — Telephone Encounter (Signed)
Called [phone] for prior authorization on the Linzess 145 mcg, form is being faxed over now

## 2013-04-11 ENCOUNTER — Telehealth: Payer: Self-pay | Admitting: *Deleted

## 2013-04-11 NOTE — Telephone Encounter (Signed)
We have the 290 mcg samples of Linzess, would this be ok to give her samples?

## 2013-04-11 NOTE — Telephone Encounter (Signed)
Received a fax from [employer], Karlene Einstein has been APPROVED

## 2013-04-25 ENCOUNTER — Encounter: Payer: Self-pay | Admitting: Internal Medicine

## 2013-04-25 ENCOUNTER — Ambulatory Visit (INDEPENDENT_AMBULATORY_CARE_PROVIDER_SITE_OTHER): Payer: 59 | Admitting: Internal Medicine

## 2013-04-25 VITALS — BP 118/86 | HR 83 | Temp 98.9°F | Wt 159.0 lb

## 2013-04-25 DIAGNOSIS — E663 Overweight: Secondary | ICD-10-CM

## 2013-04-25 MED ORDER — PHENTERMINE HCL 37.5 MG PO CAPS: 37.5000 mg | ORAL_CAPSULE | ORAL | Status: DC

## 2013-04-25 MED ORDER — LINACLOTIDE 145 MCG PO CAPS: 145.0000 ug | ORAL_CAPSULE | Freq: Every day | ORAL | Status: DC

## 2013-04-25 NOTE — Progress Notes (Signed)
Subjective:    Patient ID: Deanna Schultz, female    DOB: 1971/08/10, 42 y.o.   MRN: [id number]  HPI 41YO female presents to follow up weight loss. Doing well. Exercising by going to the [facility] and walking. Following healthy diet. Taking phentermine to help with appetite. No side effects noted.  Outpatient Encounter Prescriptions as of 04/25/2013  Medication Sig Dispense Refill  . Ascorbic Acid (VITAMIN C) 1000 MG tablet Take 1,000 mg by mouth daily.      . calcium carbonate (OS-CAL) 600 MG TABS Take 600 mg by mouth 2 (two) times daily.      . cholecalciferol (VITAMIN D) 1000 UNITS tablet Take 1,000 Units by mouth daily.      . Cyanocobalamin (B-12) 500 MCG TABS Take 1 tablet by mouth daily.      . Linaclotide (LINZESS) 145 MCG CAPS Take 1 capsule (145 mcg total) by mouth daily.  30 capsule  11  . Omega-3 Fatty Acids (FISH OIL) 1000 MG CAPS Take 3 capsules by mouth daily.      Maxwell Caul Bicarbonate (ZEGERID) 20-1100 MG CAPS Take 1 capsule by mouth daily before breakfast.  28 each  3  . phentermine 37.5 MG capsule Take 1 capsule (37.5 mg total) by mouth every morning.  30 capsule  1  . [DISCONTINUED] Linaclotide (LINZESS) 145 MCG CAPS Take 1 capsule (145 mcg total) by mouth daily.  30 capsule  11  . [DISCONTINUED] phentermine 37.5 MG capsule Take 1 capsule (37.5 mg total) by mouth every morning.  30 capsule  1   No facility-administered encounter medications on file as of 04/25/2013.   BP 118/86  Pulse 83  Temp(Src) 98.9 F (37.2 C) (Oral)  Wt 159 lb (72.122 kg)  BMI 28.17 kg/m2  SpO2 97%  LMP 03/06/2013  Review of Systems  Constitutional: Negative for fever, chills, appetite change, fatigue and unexpected weight change.  HENT: Negative for ear pain, congestion, sore throat, trouble swallowing, neck pain, voice change and sinus pressure.   Eyes: Negative for visual disturbance.  Respiratory: Negative for cough, shortness of breath, wheezing and stridor.   Cardiovascular:  Negative for chest pain, palpitations and leg swelling.  Gastrointestinal: Negative for nausea, vomiting, abdominal pain, diarrhea, constipation, blood in stool, abdominal distention and anal bleeding.  Genitourinary: Negative for dysuria and flank pain.  Musculoskeletal: Negative for myalgias, arthralgias and gait problem.  Skin: Negative for color change and rash.  Neurological: Negative for dizziness and headaches.  Hematological: Negative for adenopathy. Does not bruise/bleed easily.  Psychiatric/Behavioral: Negative for suicidal ideas, sleep disturbance and dysphoric mood. The patient is not nervous/anxious.        Objective:   Physical Exam  Constitutional: She is oriented to person, place, and time. She appears well-developed and well-nourished. No distress.  HENT:  Head: Normocephalic and atraumatic.  Right Ear: External ear normal.  Left Ear: External ear normal.  Nose: Nose normal.  Mouth/Throat: Oropharynx is clear and moist. No oropharyngeal exudate.  Eyes: Conjunctivae are normal. Pupils are equal, round, and reactive to light. Right eye exhibits no discharge. Left eye exhibits no discharge. No scleral icterus.  Neck: Normal range of motion. Neck supple. No tracheal deviation present. No thyromegaly present.  Cardiovascular: Normal rate, regular rhythm, normal heart sounds and intact distal pulses.  Exam reveals no gallop and no friction rub.   No murmur heard. Pulmonary/Chest: Effort normal and breath sounds normal. No accessory muscle usage. Not tachypneic. No respiratory distress. She has no decreased breath  sounds. She has no wheezes. She has no rhonchi. She has no rales. She exhibits no tenderness.  Musculoskeletal: Normal range of motion. She exhibits no edema and no tenderness.  Lymphadenopathy:    She has no cervical adenopathy.  Neurological: She is alert and oriented to person, place, and time. No cranial nerve deficit. She exhibits normal muscle tone. Coordination  normal.  Skin: Skin is warm and dry. No rash noted. She is not diaphoretic. No erythema. No pallor.  Psychiatric: She has a normal mood and affect. Her behavior is normal. Judgment and thought content normal.          Assessment & Plan:

## 2013-04-25 NOTE — Assessment & Plan Note (Signed)
Wt Readings from Last 3 Encounters:  04/25/13 159 lb (72.122 kg)  03/26/13 163 lb (73.936 kg)  02/11/13 169 lb (76.658 kg)   Weight down another 4lbs. Encouraged pt to continue efforts at healthy diet and regular physical activity. Will continue phentermine for appetite suppression. Follow up 4 weeks.

## 2013-05-29 ENCOUNTER — Encounter: Payer: Self-pay | Admitting: Internal Medicine

## 2013-05-29 ENCOUNTER — Ambulatory Visit (INDEPENDENT_AMBULATORY_CARE_PROVIDER_SITE_OTHER): Payer: 59 | Admitting: Internal Medicine

## 2013-05-29 VITALS — BP 120/80 | HR 77 | Temp 98.4°F | Wt 159.0 lb

## 2013-05-29 DIAGNOSIS — E663 Overweight: Secondary | ICD-10-CM

## 2013-05-29 MED ORDER — PHENTERMINE HCL 37.5 MG PO CAPS: 37.5000 mg | ORAL_CAPSULE | ORAL | Status: DC

## 2013-05-29 NOTE — Assessment & Plan Note (Signed)
Wt Readings from Last 3 Encounters:  05/29/13 159 lb (72.122 kg)  04/25/13 159 lb (72.122 kg)  03/26/13 163 lb (73.936 kg)   Encouraged continued efforts at healthy diet and regular physical activity. Will continue phentermine for appetite suppression. Follow up in 3 months and prn.

## 2013-05-29 NOTE — Progress Notes (Signed)
Subjective:    Patient ID: Deanna Schultz, female    DOB: January 25, 1971, 42 y.o.   MRN: [id number]  HPI 42 year old female with history of obesity presents for followup. She reports she was on vacation last week he notes some dietary indiscretion. However, in general she's been trying to follow a healthy diet and get regular physical activity by participating in aerobics classes at a local [employer]. She is also taking phentermine to help with appetite suppression. She denies any side effects from this medication.  Outpatient Encounter Prescriptions as of 05/29/2013  Medication Sig Dispense Refill  . Ascorbic Acid (VITAMIN C) 1000 MG tablet Take 1,000 mg by mouth daily.      . calcium carbonate (OS-CAL) 600 MG TABS Take 600 mg by mouth 2 (two) times daily.      . cholecalciferol (VITAMIN D) 1000 UNITS tablet Take 1,000 Units by mouth daily.      . Cyanocobalamin (B-12) 500 MCG TABS Take 1 tablet by mouth daily.      . Linaclotide (LINZESS) 145 MCG CAPS Take 1 capsule (145 mcg total) by mouth daily.  30 capsule  11  . Omega-3 Fatty Acids (FISH OIL) 1000 MG CAPS Take 3 capsules by mouth daily.      Maxwell Caul Bicarbonate (ZEGERID) 20-1100 MG CAPS Take 1 capsule by mouth daily before breakfast.  28 each  3  . phentermine 37.5 MG capsule Take 1 capsule (37.5 mg total) by mouth every morning.  30 capsule  1  . [DISCONTINUED] phentermine 37.5 MG capsule Take 1 capsule (37.5 mg total) by mouth every morning.  30 capsule  1   No facility-administered encounter medications on file as of 05/29/2013.   BP 120/80  Pulse 77  Temp(Src) 98.4 F (36.9 C) (Oral)  Wt 159 lb (72.122 kg)  BMI 28.17 kg/m2  SpO2 97%  Review of Systems  Constitutional: Negative for fever, chills, appetite change, fatigue and unexpected weight change.  HENT: Negative for ear pain, congestion, sore throat, trouble swallowing, neck pain, voice change and sinus pressure.   Eyes: Negative for visual disturbance.  Respiratory:  Negative for cough, shortness of breath, wheezing and stridor.   Cardiovascular: Negative for chest pain, palpitations and leg swelling.  Gastrointestinal: Negative for nausea, vomiting, abdominal pain, diarrhea, constipation, blood in stool, abdominal distention and anal bleeding.  Genitourinary: Negative for dysuria and flank pain.  Musculoskeletal: Negative for myalgias, arthralgias and gait problem.  Skin: Negative for color change and rash.  Neurological: Negative for dizziness and headaches.  Hematological: Negative for adenopathy. Does not bruise/bleed easily.  Psychiatric/Behavioral: Negative for suicidal ideas, sleep disturbance and dysphoric mood. The patient is not nervous/anxious.        Objective:   Physical Exam  Constitutional: She is oriented to person, place, and time. She appears well-developed and well-nourished. No distress.  HENT:  Head: Normocephalic and atraumatic.  Right Ear: External ear normal.  Left Ear: External ear normal.  Nose: Nose normal.  Mouth/Throat: Oropharynx is clear and moist. No oropharyngeal exudate.  Eyes: Conjunctivae are normal. Pupils are equal, round, and reactive to light. Right eye exhibits no discharge. Left eye exhibits no discharge. No scleral icterus.  Neck: Normal range of motion. Neck supple. No tracheal deviation present. No thyromegaly present.  Cardiovascular: Normal rate, regular rhythm, normal heart sounds and intact distal pulses.  Exam reveals no gallop and no friction rub.   No murmur heard. Pulmonary/Chest: Effort normal and breath sounds normal. No accessory muscle usage. Not  tachypneic. No respiratory distress. She has no decreased breath sounds. She has no wheezes. She has no rhonchi. She has no rales. She exhibits no tenderness.  Musculoskeletal: Normal range of motion. She exhibits no edema and no tenderness.  Lymphadenopathy:    She has no cervical adenopathy.  Neurological: She is alert and oriented to person, place,  and time. No cranial nerve deficit. She exhibits normal muscle tone. Coordination normal.  Skin: Skin is warm and dry. No rash noted. She is not diaphoretic. No erythema. No pallor.  Psychiatric: She has a normal mood and affect. Her behavior is normal. Judgment and thought content normal.          Assessment & Plan:

## 2013-06-27 ENCOUNTER — Encounter: Payer: Self-pay | Admitting: Internal Medicine

## 2013-08-29 ENCOUNTER — Ambulatory Visit (INDEPENDENT_AMBULATORY_CARE_PROVIDER_SITE_OTHER): Payer: 59 | Admitting: Internal Medicine

## 2013-08-29 ENCOUNTER — Encounter: Payer: Self-pay | Admitting: Internal Medicine

## 2013-08-29 VITALS — BP 120/82 | HR 92 | Temp 98.2°F | Wt 161.0 lb

## 2013-08-29 DIAGNOSIS — Z23 Encounter for immunization: Secondary | ICD-10-CM

## 2013-08-29 DIAGNOSIS — E663 Overweight: Secondary | ICD-10-CM

## 2013-08-29 MED ORDER — PHENTERMINE HCL 37.5 MG PO CAPS: 37.5000 mg | ORAL_CAPSULE | ORAL | Status: DC

## 2013-08-29 NOTE — Addendum Note (Signed)
Addended by: Chandra Batch E on: 08/29/2013 11:53 AM   Modules accepted: Orders

## 2013-08-29 NOTE — Assessment & Plan Note (Signed)
Wt Readings from Last 3 Encounters:  08/29/13 161 lb (73.029 kg)  05/29/13 159 lb (72.122 kg)  04/25/13 159 lb (72.122 kg)   Weight up 2lb today, however pt has not been taking phentermine on a regular basis. Encouraged healthy diet and regular physical activity. Will restart phentermine to help with appetite. Follow up 3 months and prn.

## 2013-08-29 NOTE — Progress Notes (Signed)
Subjective:    Patient ID: Deanna Schultz, female    DOB: 1971/02/23, 42 y.o.   MRN: [id number]  HPI 42YO female with h/o being overweight presents for follow up. Doing well. Continues to try to follow healthy diet and get regular exercise by working out at [employer]. Has not been taking phentermine on a daily basis, only occasionally.  Outpatient Encounter Prescriptions as of 08/29/2013  Medication Sig Dispense Refill  . Ascorbic Acid (VITAMIN C) 1000 MG tablet Take 1,000 mg by mouth daily.      . calcium carbonate (OS-CAL) 600 MG TABS Take 600 mg by mouth 2 (two) times daily.      . cholecalciferol (VITAMIN D) 1000 UNITS tablet Take 1,000 Units by mouth daily.      . Cyanocobalamin (B-12) 500 MCG TABS Take 1 tablet by mouth daily.      . Linaclotide (LINZESS) 145 MCG CAPS Take 1 capsule (145 mcg total) by mouth daily.  30 capsule  11  . Omega-3 Fatty Acids (FISH OIL) 1000 MG CAPS Take 3 capsules by mouth daily.      Maxwell Caul Bicarbonate (ZEGERID) 20-1100 MG CAPS Take 1 capsule by mouth daily before breakfast.  28 each  3  . phentermine 37.5 MG capsule Take 1 capsule (37.5 mg total) by mouth every morning.  30 capsule  1  . [DISCONTINUED] phentermine 37.5 MG capsule Take 1 capsule (37.5 mg total) by mouth every morning.  30 capsule  1   No facility-administered encounter medications on file as of 08/29/2013.   BP 120/82  Pulse 92  Temp(Src) 98.2 F (36.8 C) (Oral)  Wt 161 lb (73.029 kg)  BMI 28.53 kg/m2  SpO2 94%  Review of Systems  Constitutional: Negative for fever, chills, appetite change, fatigue and unexpected weight change.  HENT: Negative for ear pain, congestion, sore throat, trouble swallowing, neck pain, voice change and sinus pressure.   Eyes: Negative for visual disturbance.  Respiratory: Negative for cough, shortness of breath, wheezing and stridor.   Cardiovascular: Negative for chest pain, palpitations and leg swelling.  Gastrointestinal: Negative for nausea,  vomiting, abdominal pain, diarrhea, constipation, blood in stool, abdominal distention and anal bleeding.  Genitourinary: Negative for dysuria and flank pain.  Musculoskeletal: Negative for myalgias, arthralgias and gait problem.  Skin: Negative for color change and rash.  Neurological: Negative for dizziness and headaches.  Hematological: Negative for adenopathy. Does not bruise/bleed easily.  Psychiatric/Behavioral: Negative for suicidal ideas, sleep disturbance and dysphoric mood. The patient is not nervous/anxious.        Objective:   Physical Exam  Constitutional: She is oriented to person, place, and time. She appears well-developed and well-nourished. No distress.  HENT:  Head: Normocephalic and atraumatic.  Right Ear: External ear normal.  Left Ear: External ear normal.  Nose: Nose normal.  Mouth/Throat: Oropharynx is clear and moist. No oropharyngeal exudate.  Eyes: Conjunctivae are normal. Pupils are equal, round, and reactive to light. Right eye exhibits no discharge. Left eye exhibits no discharge. No scleral icterus.  Neck: Normal range of motion. Neck supple. No tracheal deviation present. No thyromegaly present.  Cardiovascular: Normal rate, regular rhythm, normal heart sounds and intact distal pulses.  Exam reveals no gallop and no friction rub.   No murmur heard. Pulmonary/Chest: Effort normal and breath sounds normal. No accessory muscle usage. Not tachypneic. No respiratory distress. She has no decreased breath sounds. She has no wheezes. She has no rhonchi. She has no rales. She exhibits no tenderness.  Musculoskeletal: Normal range of motion. She exhibits no edema and no tenderness.  Lymphadenopathy:    She has no cervical adenopathy.  Neurological: She is alert and oriented to person, place, and time. No cranial nerve deficit. She exhibits normal muscle tone. Coordination normal.  Skin: Skin is warm and dry. No rash noted. She is not diaphoretic. No erythema. No  pallor.  Psychiatric: She has a normal mood and affect. Her behavior is normal. Judgment and thought content normal.          Assessment & Plan:

## 2013-10-09 ENCOUNTER — Encounter: Payer: Self-pay | Admitting: Internal Medicine

## 2013-10-09 ENCOUNTER — Encounter: Payer: Self-pay | Admitting: *Deleted

## 2013-10-09 DIAGNOSIS — E663 Overweight: Secondary | ICD-10-CM

## 2013-10-09 MED ORDER — PHENTERMINE HCL 37.5 MG PO CAPS: 37.5000 mg | ORAL_CAPSULE | ORAL | Status: DC

## 2013-10-09 NOTE — Telephone Encounter (Signed)
Ok to refill 

## 2013-10-09 NOTE — Telephone Encounter (Signed)
Rx phoned in to pharmacy. Pt made aware via myChart

## 2013-11-12 ENCOUNTER — Encounter: Payer: Self-pay | Admitting: Internal Medicine

## 2013-12-05 ENCOUNTER — Encounter: Payer: Self-pay | Admitting: Internal Medicine

## 2013-12-05 ENCOUNTER — Ambulatory Visit (INDEPENDENT_AMBULATORY_CARE_PROVIDER_SITE_OTHER): Payer: 59 | Admitting: Internal Medicine

## 2013-12-05 VITALS — BP 130/84 | HR 83 | Temp 98.3°F | Wt 163.0 lb

## 2013-12-05 DIAGNOSIS — E785 Hyperlipidemia, unspecified: Secondary | ICD-10-CM

## 2013-12-05 DIAGNOSIS — K581 Irritable bowel syndrome with constipation: Secondary | ICD-10-CM

## 2013-12-05 DIAGNOSIS — E663 Overweight: Secondary | ICD-10-CM

## 2013-12-05 DIAGNOSIS — K589 Irritable bowel syndrome without diarrhea: Secondary | ICD-10-CM

## 2013-12-05 LAB — COMPREHENSIVE METABOLIC PANEL
ALT: 24 U/L (ref 0–35)
AST: 24 U/L (ref 0–37)
Albumin: 4 g/dL (ref 3.5–5.2)
Alkaline Phosphatase: 46 U/L (ref 39–117)
BUN: 12 mg/dL (ref 6–23)
CO2: 27 mEq/L (ref 19–32)
Calcium: 9 mg/dL (ref 8.4–10.5)
Chloride: 104 mEq/L (ref 96–112)
Creatinine, Ser: 0.7 mg/dL (ref 0.4–1.2)
GFR: 105.63 mL/min (ref >60.00)
Glucose, Bld: 98 mg/dL (ref 70–99)
Potassium: 4.1 mEq/L (ref 3.5–5.1)
Sodium: 137 mEq/L (ref 135–145)
Total Bilirubin: 0.3 mg/dL (ref 0.3–1.2)
Total Protein: 7.3 g/dL (ref 6.0–8.3)

## 2013-12-05 LAB — LIPID PANEL
CHOL/HDL RATIO: 4
Cholesterol: 204 mg/dL — ABNORMAL HIGH (ref 0–200)
HDL: 49.9 mg/dL (ref >39.00)
TRIGLYCERIDES: 221 mg/dL — AB (ref 0.0–149.0)
VLDL: 44.2 mg/dL — ABNORMAL HIGH (ref 0.0–40.0)

## 2013-12-05 LAB — CBC WITH DIFFERENTIAL/PLATELET
BASOS ABS: 0 10*3/uL (ref 0.0–0.1)
Basophils Relative: 0.3 % (ref 0.0–3.0)
Eosinophils Absolute: 0.1 10*3/uL (ref 0.0–0.7)
Eosinophils Relative: 0.8 % (ref 0.0–5.0)
HEMATOCRIT: 38.1 % (ref 36.0–46.0)
Hemoglobin: 13.1 g/dL (ref 12.0–15.0)
LYMPHS ABS: 2 10*3/uL (ref 0.7–4.0)
Lymphocytes Relative: 24.8 % (ref 12.0–46.0)
MCHC: 34.3 g/dL (ref 30.0–36.0)
MCV: 95 fl (ref 78.0–100.0)
MONO ABS: 0.7 10*3/uL (ref 0.1–1.0)
Monocytes Relative: 8 % (ref 3.0–12.0)
Neutro Abs: 5.4 10*3/uL (ref 1.4–7.7)
Neutrophils Relative %: 66.1 % (ref 43.0–77.0)
PLATELETS: 200 10*3/uL (ref 150.0–400.0)
RBC: 4.01 Mil/uL (ref 3.87–5.11)
RDW: 13.2 % (ref 11.5–14.6)
WBC: 8.2 10*3/uL (ref 4.5–10.5)

## 2013-12-05 LAB — HEMOGLOBIN A1C: Hgb A1c MFr Bld: 5.5 % (ref 4.6–6.5)

## 2013-12-05 LAB — TSH: TSH: 0.86 u[IU]/mL (ref 0.35–5.50)

## 2013-12-05 LAB — LDL CHOLESTEROL, DIRECT: Direct LDL: 124.2 mg/dL

## 2013-12-05 MED ORDER — PHENTERMINE HCL 37.5 MG PO CAPS: 37.5000 mg | ORAL_CAPSULE | ORAL | Status: DC

## 2013-12-05 NOTE — Assessment & Plan Note (Signed)
Wt Readings from Last 3 Encounters:  12/05/13 163 lb (73.936 kg)  08/29/13 161 lb (73.029 kg)  05/29/13 159 lb (72.122 kg)   Encourage continued efforts at healthy diet and regular physical activity. Will continue phentermine for appetite suppression. Monitor BP monthly and follow up in 3 months or sooner as needed.

## 2013-12-05 NOTE — Progress Notes (Signed)
Pre-visit discussion using our clinic review tool. No additional management support is needed unless otherwise documented below in the visit note.  

## 2013-12-05 NOTE — Assessment & Plan Note (Signed)
Reviewed previous labs from 2013 showing slight elevation of cholesterol. Will repeat lipid profile with labs today.

## 2013-12-05 NOTE — Assessment & Plan Note (Signed)
Symptoms well controlled with Linzess. Will continue. 

## 2013-12-05 NOTE — Progress Notes (Signed)
Subjective:    Patient ID: Deanna Schultz, female    DOB: [birth year], 43 y.o.   MRN: [id number]  HPI 43 year old female with history of irritable bowel syndrome, obesity presents for followup. She reports that she is generally feeling well. She reports that symptoms of chronic constipation are much improved with use of Linzess. No recent abdominal pain. She continues to try to follow a healthy diet. She is exercising between 5 and 6 hours per week. She continues on phentermine for appetite suppression. She reports good control of her appetite and no side effects from this medication.  Outpatient Encounter Prescriptions as of 12/05/2013  Medication Sig  . Linaclotide (LINZESS) 145 MCG CAPS Take 1 capsule (145 mcg total) by mouth daily.  Maxwell Caul Bicarbonate (ZEGERID) 20-1100 MG CAPS Take 1 capsule by mouth daily before breakfast.  . phentermine 37.5 MG capsule Take 1 capsule (37.5 mg total) by mouth every morning.  . [DISCONTINUED] phentermine 37.5 MG capsule Take 1 capsule (37.5 mg total) by mouth every morning.  . Ascorbic Acid (VITAMIN C) 1000 MG tablet Take 1,000 mg by mouth daily.  . cholecalciferol (VITAMIN D) 1000 UNITS tablet Take 1,000 Units by mouth daily.   BP 130/84  Pulse 83  Temp(Src) 98.3 F (36.8 C) (Oral)  Wt 163 lb (73.936 kg)  SpO2 98%  Review of Systems  Constitutional: Negative for fever, chills, appetite change, fatigue and unexpected weight change.  HENT: Negative for congestion, ear pain, sinus pressure, sore throat, trouble swallowing and voice change.   Eyes: Negative for visual disturbance.  Respiratory: Negative for cough, shortness of breath, wheezing and stridor.   Cardiovascular: Negative for chest pain, palpitations and leg swelling.  Gastrointestinal: Negative for nausea, vomiting, abdominal pain, diarrhea, constipation, blood in stool, abdominal distention and anal bleeding.  Genitourinary: Negative for dysuria and flank pain.    Musculoskeletal: Negative for arthralgias, gait problem, myalgias and neck pain.  Skin: Negative for color change and rash.  Neurological: Negative for dizziness and headaches.  Hematological: Negative for adenopathy. Does not bruise/bleed easily.  Psychiatric/Behavioral: Negative for suicidal ideas, sleep disturbance and dysphoric mood. The patient is not nervous/anxious.        Objective:   Physical Exam  Constitutional: She is oriented to person, place, and time. She appears well-developed and well-nourished. No distress.  HENT:  Head: Normocephalic and atraumatic.  Right Ear: External ear normal.  Left Ear: External ear normal.  Nose: Nose normal.  Mouth/Throat: Oropharynx is clear and moist. No oropharyngeal exudate.  Eyes: Conjunctivae are normal. Pupils are equal, round, and reactive to light. Right eye exhibits no discharge. Left eye exhibits no discharge. No scleral icterus.  Neck: Normal range of motion. Neck supple. No tracheal deviation present. No thyromegaly present.  Cardiovascular: Normal rate, regular rhythm, normal heart sounds and intact distal pulses.  Exam reveals no gallop and no friction rub.   No murmur heard. Pulmonary/Chest: Effort normal and breath sounds normal. No accessory muscle usage. Not tachypneic. No respiratory distress. She has no decreased breath sounds. She has no wheezes. She has no rhonchi. She has no rales. She exhibits no tenderness.  Musculoskeletal: Normal range of motion. She exhibits no edema and no tenderness.  Lymphadenopathy:    She has no cervical adenopathy.  Neurological: She is alert and oriented to person, place, and time. No cranial nerve deficit. She exhibits normal muscle tone. Coordination normal.  Skin: Skin is warm and dry. No rash noted. She is not diaphoretic. No erythema.  No pallor.  Psychiatric: She has a normal mood and affect. Her behavior is normal. Judgment and thought content normal.          Assessment & Plan:

## 2013-12-11 ENCOUNTER — Other Ambulatory Visit: Payer: Self-pay | Admitting: Internal Medicine

## 2014-02-21 ENCOUNTER — Encounter: Payer: Self-pay | Admitting: Internal Medicine

## 2014-02-21 DIAGNOSIS — E663 Overweight: Secondary | ICD-10-CM

## 2014-02-21 MED ORDER — PHENTERMINE HCL 37.5 MG PO CAPS: 37.5000 mg | ORAL_CAPSULE | ORAL | Status: DC

## 2014-02-24 ENCOUNTER — Other Ambulatory Visit: Payer: Self-pay | Admitting: Internal Medicine

## 2014-03-17 ENCOUNTER — Ambulatory Visit (INDEPENDENT_AMBULATORY_CARE_PROVIDER_SITE_OTHER): Payer: 59 | Admitting: Internal Medicine

## 2014-03-17 ENCOUNTER — Encounter: Payer: Self-pay | Admitting: Internal Medicine

## 2014-03-17 VITALS — BP 124/90 | HR 87 | Temp 98.2°F | Wt 160.0 lb

## 2014-03-17 DIAGNOSIS — E663 Overweight: Secondary | ICD-10-CM

## 2014-03-17 DIAGNOSIS — D649 Anemia, unspecified: Secondary | ICD-10-CM | POA: Insufficient documentation

## 2014-03-17 LAB — CBC WITH DIFFERENTIAL/PLATELET
BASOS PCT: 0.3 % (ref 0.0–3.0)
Basophils Absolute: 0 10*3/uL (ref 0.0–0.1)
EOS ABS: 0.1 10*3/uL (ref 0.0–0.7)
Eosinophils Relative: 0.9 % (ref 0.0–5.0)
HCT: 42.2 % (ref 36.0–46.0)
Hemoglobin: 14.2 g/dL (ref 12.0–15.0)
Lymphocytes Relative: 29.1 % (ref 12.0–46.0)
Lymphs Abs: 2.8 10*3/uL (ref 0.7–4.0)
MCHC: 33.6 g/dL (ref 30.0–36.0)
MCV: 96.9 fl (ref 78.0–100.0)
MONO ABS: 1 10*3/uL (ref 0.1–1.0)
Monocytes Relative: 10.1 % (ref 3.0–12.0)
Neutro Abs: 5.7 10*3/uL (ref 1.4–7.7)
Neutrophils Relative %: 59.6 % (ref 43.0–77.0)
PLATELETS: 232 10*3/uL (ref 150.0–400.0)
RBC: 4.35 Mil/uL (ref 3.87–5.11)
RDW: 13.4 % (ref 11.5–14.6)
WBC: 9.5 10*3/uL (ref 4.5–10.5)

## 2014-03-17 LAB — FERRITIN: Ferritin: 33.8 ng/mL (ref 10.0–291.0)

## 2014-03-17 MED ORDER — PHENTERMINE HCL 37.5 MG PO CAPS: 37.5000 mg | ORAL_CAPSULE | ORAL | Status: DC

## 2014-03-17 NOTE — Assessment & Plan Note (Signed)
Wt Readings from Last 3 Encounters:  03/17/14 160 lb (72.576 kg)  12/05/13 163 lb (73.936 kg)  08/29/13 161 lb (73.029 kg)   Congratulated pt on weight loss. Encouraged continued effort at healthy diet and exercise. Will continue phentermine, but discussed coming off medication next 1-2 months.

## 2014-03-17 NOTE — Progress Notes (Signed)
Pre visit review using our clinic review tool, if applicable. No additional management support is needed unless otherwise documented below in the visit note. 

## 2014-03-17 NOTE — Assessment & Plan Note (Signed)
Will check CBC and ferritin to assess for iron deficiency.

## 2014-03-17 NOTE — Progress Notes (Signed)
Subjective:    Patient ID: Deanna ReekKristie L Nicolaou, female    DOB: Feb 13, 1971, 43 y.o.   MRN: [id number]  HPI 43YO female presents for follow up. Exercising 3-4 days per week.. Following healthy diet. Taking Phentermine. No side effects noted.  Went to give blood a few weeks ago. Hgb was 12, and was not able to give blood. Menses lasting about 3 days. Notes sig cramping prior to starting. Feels tired. Feels that bleeding is moderate not heavy. No rectal bleeding or blood in stool. Eats a regular diet with lean meat.  Review of Systems  Constitutional: Positive for fatigue. Negative for fever, chills, appetite change and unexpected weight change.  HENT: Negative for congestion, ear pain, sinus pressure, sore throat, trouble swallowing and voice change.   Eyes: Negative for visual disturbance.  Respiratory: Negative for cough, shortness of breath, wheezing and stridor.   Cardiovascular: Negative for chest pain, palpitations and leg swelling.  Gastrointestinal: Negative for nausea, vomiting, abdominal pain, diarrhea, constipation, blood in stool, abdominal distention and anal bleeding.  Genitourinary: Negative for dysuria and flank pain.  Musculoskeletal: Negative for arthralgias, gait problem, myalgias and neck pain.  Skin: Negative for color change and rash.  Neurological: Negative for dizziness and headaches.  Hematological: Negative for adenopathy. Does not bruise/bleed easily.  Psychiatric/Behavioral: Negative for suicidal ideas, sleep disturbance and dysphoric mood. The patient is not nervous/anxious.        Objective:    BP 124/90  Pulse 87  Temp(Src) 98.2 F (36.8 C) (Oral)  Wt 160 lb (72.576 kg)  SpO2 99% Physical Exam  Constitutional: She is oriented to person, place, and time. She appears well-developed and well-nourished. No distress.  HENT:  Head: Normocephalic and atraumatic.  Right Ear: External ear normal.  Left Ear: External ear normal.  Nose: Nose normal.    Mouth/Throat: Oropharynx is clear and moist. No oropharyngeal exudate.  Eyes: Conjunctivae are normal. Pupils are equal, round, and reactive to light. Right eye exhibits no discharge. Left eye exhibits no discharge. No scleral icterus.  Neck: Normal range of motion. Neck supple. No tracheal deviation present. No thyromegaly present.  Cardiovascular: Normal rate, regular rhythm, normal heart sounds and intact distal pulses.  Exam reveals no gallop and no friction rub.   No murmur heard. Pulmonary/Chest: Effort normal and breath sounds normal. No accessory muscle usage. Not tachypneic. No respiratory distress. She has no decreased breath sounds. She has no wheezes. She has no rhonchi. She has no rales. She exhibits no tenderness.  Musculoskeletal: Normal range of motion. She exhibits no edema and no tenderness.  Lymphadenopathy:    She has no cervical adenopathy.  Neurological: She is alert and oriented to person, place, and time. No cranial nerve deficit. She exhibits normal muscle tone. Coordination normal.  Skin: Skin is warm and dry. No rash noted. She is not diaphoretic. No erythema. No pallor.  Psychiatric: She has a normal mood and affect. Her behavior is normal. Judgment and thought content normal.          Assessment & Plan:   Problem List Items Addressed This Visit   Anemia     Will check CBC and ferritin to assess for iron deficiency.     Relevant Orders      CBC with Differential      Ferritin   Overweight - Primary      Wt Readings from Last 3 Encounters:  03/17/14 160 lb (72.576 kg)  12/05/13 163 lb (73.936 kg)  08/29/13 161  lb (73.029 kg)   Congratulated pt on weight loss. Encouraged continued effort at healthy diet and exercise. Will continue phentermine, but discussed coming off medication next 1-2 months.    Relevant Medications      phentermine capsule       Return in about 3 months (around 06/16/2014) for Recheck.

## 2014-05-15 ENCOUNTER — Other Ambulatory Visit: Payer: Self-pay | Admitting: Internal Medicine

## 2014-06-17 ENCOUNTER — Ambulatory Visit: Payer: 59 | Admitting: Internal Medicine

## 2014-07-14 ENCOUNTER — Encounter: Payer: Self-pay | Admitting: Internal Medicine

## 2014-07-14 ENCOUNTER — Ambulatory Visit (INDEPENDENT_AMBULATORY_CARE_PROVIDER_SITE_OTHER): Payer: 59 | Admitting: Internal Medicine

## 2014-07-14 VITALS — BP 140/90 | HR 76 | Temp 98.2°F | Ht 63.0 in | Wt 168.0 lb

## 2014-07-14 DIAGNOSIS — R030 Elevated blood-pressure reading, without diagnosis of hypertension: Secondary | ICD-10-CM

## 2014-07-14 DIAGNOSIS — IMO0001 Reserved for inherently not codable concepts without codable children: Secondary | ICD-10-CM

## 2014-07-14 DIAGNOSIS — E663 Overweight: Secondary | ICD-10-CM

## 2014-07-14 MED ORDER — PHENTERMINE HCL 37.5 MG PO CAPS: 37.5000 mg | ORAL_CAPSULE | ORAL | Status: DC

## 2014-07-14 NOTE — Assessment & Plan Note (Signed)
Wt Readings from Last 3 Encounters:  07/14/14 168 lb (76.204 kg)  03/17/14 160 lb (72.576 kg)  12/05/13 163 lb (73.936 kg)   Body mass index is 29.77 kg/(m^2). Weight up today, however on menses and has been off phentermine. Discussed restarting phentermine, but will wait until BP recheck later this week by nurse.

## 2014-07-14 NOTE — Patient Instructions (Signed)
Blood pressure check later this week.  Follow up in 4 weeks.

## 2014-07-14 NOTE — Assessment & Plan Note (Signed)
BP Readings from Last 3 Encounters:  07/14/14 140/90  03/17/14 124/90  12/05/13 130/84   BP elevated today. BP has been borderline elevated on previous visits. Discussed using alternative medication to phentermine because of elevated BP. Discussed adding medication to help lower BP. Will hold off and have BP check later this week. Recent renal function was normal.

## 2014-07-14 NOTE — Progress Notes (Signed)
Subjective:    Patient ID: Deanna Schultz, female    DOB: Aug 27, 1971, 43 y.o.   MRN: [id number]  HPI 43YO female presents for follow up.  Overweight - No recent weight loss. Off phentermine this week. On menses. At camp with kids all last week. Did not sleep well last night.  Review of Systems  Constitutional: Positive for fatigue. Negative for fever, chills, appetite change and unexpected weight change.  Eyes: Negative for visual disturbance.  Respiratory: Negative for shortness of breath.   Cardiovascular: Negative for chest pain and leg swelling.  Gastrointestinal: Negative for abdominal pain.  Skin: Negative for color change and rash.  Neurological: Positive for headaches.  Hematological: Negative for adenopathy. Does not bruise/bleed easily.  Psychiatric/Behavioral: Positive for sleep disturbance. Negative for dysphoric mood. The patient is not nervous/anxious.        Objective:    BP 140/90  Pulse 76  Temp(Src) 98.2 F (36.8 C) (Oral)  Ht 5\' 3"  (1.6 m)  Wt 168 lb (76.204 kg)  BMI 29.77 kg/m2  SpO2 96% Physical Exam  Constitutional: She is oriented to person, place, and time. She appears well-developed and well-nourished. No distress.  HENT:  Head: Normocephalic and atraumatic.  Right Ear: External ear normal.  Left Ear: External ear normal.  Nose: Nose normal.  Mouth/Throat: Oropharynx is clear and moist. No oropharyngeal exudate.  Eyes: Conjunctivae are normal. Pupils are equal, round, and reactive to light. Right eye exhibits no discharge. Left eye exhibits no discharge. No scleral icterus.  Neck: Normal range of motion. Neck supple. No tracheal deviation present. No thyromegaly present.  Cardiovascular: Normal rate, regular rhythm, normal heart sounds and intact distal pulses.  Exam reveals no gallop and no friction rub.   No murmur heard. Pulmonary/Chest: Effort normal and breath sounds normal. No accessory muscle usage. Not tachypneic. No respiratory  distress. She has no decreased breath sounds. She has no wheezes. She has no rhonchi. She has no rales. She exhibits no tenderness.  Musculoskeletal: Normal range of motion. She exhibits no edema and no tenderness.  Lymphadenopathy:    She has no cervical adenopathy.  Neurological: She is alert and oriented to person, place, and time. No cranial nerve deficit. She exhibits normal muscle tone. Coordination normal.  Skin: Skin is warm and dry. No rash noted. She is not diaphoretic. No erythema. No pallor.  Psychiatric: She has a normal mood and affect. Her behavior is normal. Judgment and thought content normal.          Assessment & Plan:   Problem List Items Addressed This Visit     Unprioritized   Elevated BP      BP Readings from Last 3 Encounters:  07/14/14 140/90  03/17/14 124/90  12/05/13 130/84   BP elevated today. BP has been borderline elevated on previous visits. Discussed using alternative medication to phentermine because of elevated BP. Discussed adding medication to help lower BP. Will hold off and have BP check later this week. Recent renal function was normal.    Overweight - Primary      Wt Readings from Last 3 Encounters:  07/14/14 168 lb (76.204 kg)  03/17/14 160 lb (72.576 kg)  12/05/13 163 lb (73.936 kg)   Body mass index is 29.77 kg/(m^2). Weight up today, however on menses and has been off phentermine. Discussed restarting phentermine, but will wait until BP recheck later this week by nurse.    Relevant Medications      phentermine capsule  Return in about 4 weeks (around 08/11/2014) for Recheck.

## 2014-07-14 NOTE — Progress Notes (Signed)
Pre visit review using our clinic review tool, if applicable. No additional management support is needed unless otherwise documented below in the visit note. 

## 2014-07-16 ENCOUNTER — Ambulatory Visit: Payer: 59 | Admitting: *Deleted

## 2014-07-16 ENCOUNTER — Telehealth: Payer: Self-pay | Admitting: *Deleted

## 2014-07-16 VITALS — BP 142/88 | HR 90

## 2014-07-16 DIAGNOSIS — IMO0001 Reserved for inherently not codable concepts without codable children: Secondary | ICD-10-CM

## 2014-07-16 DIAGNOSIS — R030 Elevated blood-pressure reading, without diagnosis of hypertension: Principal | ICD-10-CM

## 2014-07-16 NOTE — Telephone Encounter (Signed)
BP is still slightly high on phentermine. I would like for her to STOP the phentermine and have repeat BP check next week.

## 2014-07-16 NOTE — Telephone Encounter (Signed)
Sent mychart message

## 2014-07-16 NOTE — Telephone Encounter (Signed)
Pt presents for BP check. Doing well without complaints. Took Phentermine last 2 days. BP 142/88, HR 90.

## 2014-07-16 NOTE — Progress Notes (Signed)
Pt presents for BP check. Doing well without complaints. Took Phentermine last 2 days. BP 142/88, HR 90. Dr. Dan HumphreysWalker notified.

## 2014-07-23 ENCOUNTER — Ambulatory Visit (INDEPENDENT_AMBULATORY_CARE_PROVIDER_SITE_OTHER): Payer: 59 | Admitting: *Deleted

## 2014-07-23 ENCOUNTER — Telehealth: Payer: Self-pay | Admitting: *Deleted

## 2014-07-23 VITALS — BP 148/82 | HR 89

## 2014-07-23 DIAGNOSIS — R030 Elevated blood-pressure reading, without diagnosis of hypertension: Secondary | ICD-10-CM

## 2014-07-23 DIAGNOSIS — IMO0001 Reserved for inherently not codable concepts without codable children: Secondary | ICD-10-CM

## 2014-07-23 NOTE — Telephone Encounter (Signed)
OK. She should continue off phentermine and we can discuss at follow up.

## 2014-07-23 NOTE — Progress Notes (Signed)
Pt presented for BP check. HR 89, BP 148/82. Pt doing well without complaints. Stopped Phentermine x 1 week ago. Dr. Sherrod notified. Pt has follow up appointment 08/14/14 

## 2014-07-23 NOTE — Telephone Encounter (Signed)
Pt presented for BP check. HR 89, BP 148/82. Pt doing well without complaints. Stopped Phentermine x 1 week ago. Dr. Dan Humphreys notified. Pt has follow up appointment 08/14/14

## 2014-07-23 NOTE — Telephone Encounter (Signed)
Sent my chart per patient request.

## 2014-07-25 NOTE — Telephone Encounter (Signed)
Mailed unread message to pt  

## 2014-08-10 LAB — HM PAP SMEAR

## 2014-08-10 LAB — HM MAMMOGRAPHY

## 2014-08-14 ENCOUNTER — Encounter: Payer: Self-pay | Admitting: Internal Medicine

## 2014-08-14 ENCOUNTER — Ambulatory Visit: Payer: 59 | Admitting: Internal Medicine

## 2014-08-14 ENCOUNTER — Ambulatory Visit (INDEPENDENT_AMBULATORY_CARE_PROVIDER_SITE_OTHER): Payer: 59 | Admitting: Internal Medicine

## 2014-08-14 VITALS — BP 144/90 | HR 85 | Temp 98.3°F | Ht 63.0 in | Wt 168.0 lb

## 2014-08-14 DIAGNOSIS — I10 Essential (primary) hypertension: Secondary | ICD-10-CM

## 2014-08-14 DIAGNOSIS — E663 Overweight: Secondary | ICD-10-CM

## 2014-08-14 LAB — COMPREHENSIVE METABOLIC PANEL
ALT: 32 U/L (ref 0–35)
AST: 30 U/L (ref 0–37)
Albumin: 4.1 g/dL (ref 3.5–5.2)
Alkaline Phosphatase: 45 U/L (ref 39–117)
BUN: 13 mg/dL (ref 6–23)
CALCIUM: 9.1 mg/dL (ref 8.4–10.5)
CHLORIDE: 103 meq/L (ref 96–112)
CO2: 23 meq/L (ref 19–32)
CREATININE: 0.7 mg/dL (ref 0.4–1.2)
GFR: 103.45 mL/min (ref >60.00)
GLUCOSE: 95 mg/dL (ref 70–99)
Potassium: 3.9 mEq/L (ref 3.5–5.1)
Sodium: 137 mEq/L (ref 135–145)
Total Bilirubin: 0.3 mg/dL (ref 0.2–1.2)
Total Protein: 7.8 g/dL (ref 6.0–8.3)

## 2014-08-14 LAB — MICROALBUMIN / CREATININE URINE RATIO
Creatinine,U: 18.8 mg/dL
MICROALB UR: 0.2 mg/dL (ref 0.0–1.9)
MICROALB/CREAT RATIO: 1.1 mg/g (ref 0.0–30.0)

## 2014-08-14 LAB — TSH: TSH: 0.84 u[IU]/mL (ref 0.35–4.50)

## 2014-08-14 MED ORDER — AMLODIPINE BESYLATE 2.5 MG PO TABS: 2.5000 mg | ORAL_TABLET | Freq: Every day | ORAL | Status: DC

## 2014-08-14 NOTE — Progress Notes (Signed)
Pre visit review using our clinic review tool, if applicable. No additional management support is needed unless otherwise documented below in the visit note. 

## 2014-08-14 NOTE — Assessment & Plan Note (Signed)
BP Readings from Last 3 Encounters:  08/14/14 144/90  07/23/14 148/82  07/16/14 142/88   BP continues to be elevated. Will check renal and thyroid function with labs. Given her young age, will check renal artery dopplers. Encouraged continued healthy diet and exercise. Will start amlodipine 2.5mg  daily. Recheck BP in 4 weeks.

## 2014-08-14 NOTE — Patient Instructions (Signed)
Start Amlodipine 2.5mg  once daily to help lower blood pressure.  We will set up an ultrasound of the renal arteries.  Follow up in 4 weeks.

## 2014-08-14 NOTE — Progress Notes (Signed)
   Subjective:    Patient ID: Deanna Schultz, female    DOB: September 11, 1971, 43 y.o.   MRN: [id number]  HPI 43YO female presents for follow up. BP continues to be elevated, near 140s/90s. No symptoms of chest pain, headache, palpitations. No new concerns today. Both parents have HTN and CAD. Not taking any supplements. Not taking phentermine.  Review of Systems  Constitutional: Negative for fever, chills, appetite change, fatigue and unexpected weight change.  Eyes: Negative for visual disturbance.  Respiratory: Negative for shortness of breath.   Cardiovascular: Negative for chest pain and leg swelling.  Gastrointestinal: Negative for abdominal pain.  Skin: Negative for color change and rash.  Neurological: Negative for headaches.  Hematological: Negative for adenopathy. Does not bruise/bleed easily.  Psychiatric/Behavioral: Negative for dysphoric mood. The patient is not nervous/anxious.        Objective:    BP 144/90  Pulse 85  Temp(Src) 98.3 F (36.8 C) (Oral)  Ht  (1.6 m)  Wt 168 lb (76.204 kg)  BMI 29.77 kg/m2  SpO2 99%  LMP 08/09/2014 Physical Exam  Constitutional: She is oriented to person, place, and time. She appears well-developed and well-nourished. No distress.  HENT:  Head: Normocephalic and atraumatic.  Right Ear: External ear normal.  Left Ear: External ear normal.  Nose: Nose normal.  Mouth/Throat: Oropharynx is clear and moist. No oropharyngeal exudate.  Eyes: Conjunctivae are normal. Pupils are equal, round, and reactive to light. Right eye exhibits no discharge. Left eye exhibits no discharge. No scleral icterus.  Neck: Normal range of motion. Neck supple. No tracheal deviation present. No thyromegaly present.  Cardiovascular: Normal rate, regular rhythm, normal heart sounds and intact distal pulses.  Exam reveals no gallop and no friction rub.   No murmur heard. Pulmonary/Chest: Effort normal and breath sounds normal. No accessory muscle usage.  Not tachypneic. No respiratory distress. She has no decreased breath sounds. She has no wheezes. She has no rhonchi. She has no rales. She exhibits no tenderness.  Musculoskeletal: Normal range of motion. She exhibits no edema and no tenderness.  Lymphadenopathy:    She has no cervical adenopathy.  Neurological: She is alert and oriented to person, place, and time. No cranial nerve deficit. She exhibits normal muscle tone. Coordination normal.  Skin: Skin is warm and dry. No rash noted. She is not diaphoretic. No erythema. No pallor.  Psychiatric: She has a normal mood and affect. Her behavior is normal. Judgment and thought content normal.          Assessment & Plan:   Problem List Items Addressed This Visit     Unprioritized   Essential hypertension, benign - Primary      BP Readings from Last 3 Encounters:  08/14/14 144/90  07/23/14 148/82  07/16/14 142/88   BP continues to be elevated. Will check renal and thyroid function with labs. Given her young age, will check renal artery dopplers. Encouraged continued healthy diet and exercise. Will start amlodipine 2.5mg  daily. Recheck BP in 4 weeks.    Relevant Medications      amLODIpine (NORVASC)  tablet   Other Relevant Orders      Ambulatory referral to Vascular Surgery      TSH      Comprehensive metabolic panel      Microalbumin / creatinine urine ratio   Overweight       Return in about 4 weeks (around 09/11/2014) for Recheck.

## 2014-08-26 ENCOUNTER — Telehealth: Payer: Self-pay | Admitting: Internal Medicine

## 2014-08-26 NOTE — Telephone Encounter (Signed)
Renal artery doppler normal.

## 2014-09-10 ENCOUNTER — Telehealth: Payer: Self-pay | Admitting: Internal Medicine

## 2014-09-10 NOTE — Telephone Encounter (Signed)
Pt had flu shot at [facility] today.

## 2014-09-10 NOTE — Telephone Encounter (Signed)
Updated chart.

## 2014-09-17 ENCOUNTER — Encounter: Payer: Self-pay | Admitting: Internal Medicine

## 2014-09-17 ENCOUNTER — Ambulatory Visit (INDEPENDENT_AMBULATORY_CARE_PROVIDER_SITE_OTHER): Payer: 59 | Admitting: Internal Medicine

## 2014-09-17 VITALS — BP 146/80 | HR 86 | Temp 98.1°F | Ht 63.0 in | Wt 172.8 lb

## 2014-09-17 DIAGNOSIS — E669 Obesity, unspecified: Secondary | ICD-10-CM | POA: Insufficient documentation

## 2014-09-17 DIAGNOSIS — I10 Essential (primary) hypertension: Secondary | ICD-10-CM

## 2014-09-17 MED ORDER — HYDROCHLOROTHIAZIDE 12.5 MG PO CAPS: 12.5000 mg | ORAL_CAPSULE | Freq: Every day | ORAL | Status: DC

## 2014-09-17 MED ORDER — AMLODIPINE BESYLATE 5 MG PO TABS: 5.0000 mg | ORAL_TABLET | Freq: Every day | ORAL | Status: DC

## 2014-09-17 NOTE — Patient Instructions (Signed)
Increase Amlodipine to 5mg  daily.  Add HCTZ 12.5mg  daily as needed for increased swelling.  Follow up in 4 weeks.

## 2014-09-17 NOTE — Assessment & Plan Note (Signed)
BP Readings from Last 3 Encounters:  09/17/14 146/80  08/14/14 144/90  07/23/14 148/82   BP continues to be elevated. Will increase Amlodipine to 5mg  daily. Add HCTZ daily as needed for LE swelling. Continue exercise and healthy diet. Reviewed renal artery doppler which was normal.

## 2014-09-17 NOTE — Progress Notes (Signed)
Subjective:    Patient ID: Deanna ReekKristie L Strite, female    DOB: Aug 18, 1971, 43 y.o.   MRN: [id number]  HPI 43YO female presents for follow up. Recently, has had elevated BP. Renal artery doppler was normal. Started on Amlodipine 2.5mg  daily. No real change in BP readings. Feeling well. No chest pain, headache.Occasionally has swelling in her legs and hands at night. Continues to exercise about 6hr per week and tries to follow a healthy diet. Worried about recent weight gain. Planning to start recording caloric intake.  Review of Systems  Constitutional: Negative for fever, chills, appetite change, fatigue and unexpected weight change.  Eyes: Negative for visual disturbance.  Respiratory: Negative for shortness of breath.   Cardiovascular: Positive for leg swelling. Negative for chest pain and palpitations.  Gastrointestinal: Negative for nausea, vomiting, abdominal pain, diarrhea and constipation.  Musculoskeletal: Negative for arthralgias and myalgias.  Skin: Negative for color change and rash.  Hematological: Negative for adenopathy. Does not bruise/bleed easily.  Psychiatric/Behavioral: Negative for dysphoric mood. The patient is not nervous/anxious.        Objective:    BP 146/80  Pulse 86  Temp(Src) 98.1 F (36.7 C) (Oral)  Ht 5\' 3"  (1.6 m)  Wt 172 lb 12 oz (78.359 kg)  BMI 30.61 kg/m2  SpO2 97%  LMP 09/05/2014 Physical Exam  Constitutional: She is oriented to person, place, and time. She appears well-developed and well-nourished. No distress.  HENT:  Head: Normocephalic and atraumatic.  Right Ear: External ear normal.  Left Ear: External ear normal.  Nose: Nose normal.  Mouth/Throat: Oropharynx is clear and moist. No oropharyngeal exudate.  Eyes: Conjunctivae are normal. Pupils are equal, round, and reactive to light. Right eye exhibits no discharge. Left eye exhibits no discharge. No scleral icterus.  Neck: Normal range of motion. Neck supple. No tracheal deviation  present. No thyromegaly present.  Cardiovascular: Normal rate, regular rhythm, normal heart sounds and intact distal pulses.  Exam reveals no gallop and no friction rub.   No murmur heard. Pulmonary/Chest: Effort normal and breath sounds normal. No accessory muscle usage. Not tachypneic. No respiratory distress. She has no decreased breath sounds. She has no wheezes. She has no rhonchi. She has no rales. She exhibits no tenderness.  Musculoskeletal: Normal range of motion. She exhibits no edema and no tenderness.  Lymphadenopathy:    She has no cervical adenopathy.  Neurological: She is alert and oriented to person, place, and time. No cranial nerve deficit. She exhibits normal muscle tone. Coordination normal.  Skin: Skin is warm and dry. No rash noted. She is not diaphoretic. No erythema. No pallor.  Psychiatric: She has a normal mood and affect. Her behavior is normal. Judgment and thought content normal.          Assessment & Plan:  Over 25min of which >50% spent in face-to-face contact with patient discussing plan of care  Problem List Items Addressed This Visit     Unprioritized   Essential hypertension, benign - Primary      BP Readings from Last 3 Encounters:  09/17/14 146/80  08/14/14 144/90  07/23/14 148/82   BP continues to be elevated. Will increase Amlodipine to 5mg  daily. Add HCTZ daily as needed for LE swelling. Continue exercise and healthy diet. Reviewed renal artery doppler which was normal.    Relevant Medications      amLODIpine (NORVASC) tablet      hydrochlorothiazide (MICROZIDE) 12.5 MG capsule   Obesity (BMI 30-39.9)  Wt Readings from Last 3 Encounters:  09/17/14 172 lb 12 oz (78.359 kg)  08/14/14 168 lb (76.204 kg)  07/14/14 168 lb (76.204 kg)   Body mass index is 30.61 kg/(m^2). Encouraged continued healthy diet and exercise with goal of weight loss. Encouraged her to keep a food diary.        Return in about 4 weeks (around 10/15/2014)  for Recheck.

## 2014-09-17 NOTE — Assessment & Plan Note (Signed)
Wt Readings from Last 3 Encounters:  09/17/14 172 lb 12 oz (78.359 kg)  08/14/14 168 lb (76.204 kg)  07/14/14 168 lb (76.204 kg)   Body mass index is 30.61 kg/(m^2). Encouraged continued healthy diet and exercise with goal of weight loss. Encouraged her to keep a food diary.

## 2014-09-17 NOTE — Progress Notes (Signed)
Pre visit review using our clinic review tool, if applicable. No additional management support is needed unless otherwise documented below in the visit note. 

## 2014-09-18 ENCOUNTER — Telehealth: Payer: Self-pay | Admitting: Internal Medicine

## 2014-09-18 NOTE — Telephone Encounter (Signed)
emmi emailed °

## 2014-10-14 ENCOUNTER — Encounter: Payer: Self-pay | Admitting: Internal Medicine

## 2014-11-03 ENCOUNTER — Ambulatory Visit (INDEPENDENT_AMBULATORY_CARE_PROVIDER_SITE_OTHER): Payer: 59 | Admitting: Internal Medicine

## 2014-11-03 ENCOUNTER — Encounter: Payer: Self-pay | Admitting: Internal Medicine

## 2014-11-03 VITALS — BP 133/72 | HR 71 | Temp 97.7°F | Ht 63.0 in | Wt 173.8 lb

## 2014-11-03 DIAGNOSIS — I10 Essential (primary) hypertension: Secondary | ICD-10-CM

## 2014-11-03 NOTE — Patient Instructions (Signed)
Physical exam in 6 months.  Fasting labs prior to exam.

## 2014-11-03 NOTE — Progress Notes (Signed)
   Subjective:    Patient ID: Deanna ReekKristie L Hopple, female    DOB: [birth year], 43 y.o.   MRN: [id number]  HPI  43YO female presents for follow up.  HTN - BP at home typically near 130/70s. No chest pain, headache. Feeling well. Compliant with medications. No concerns today.  BP Readings from Last 3 Encounters:  11/03/14 133/72  09/17/14 146/80  08/14/14 144/90     Past medical, surgical, family and social history per today's encounter.  Review of Systems  Constitutional: Negative for fever, chills, appetite change, fatigue and unexpected weight change.  Eyes: Negative for visual disturbance.  Respiratory: Negative for shortness of breath.   Cardiovascular: Negative for chest pain and leg swelling.  Gastrointestinal: Negative for nausea, vomiting, abdominal pain, diarrhea and constipation.  Skin: Negative for color change and rash.  Hematological: Negative for adenopathy. Does not bruise/bleed easily.  Psychiatric/Behavioral: Negative for dysphoric mood. The patient is not nervous/anxious.        Objective:    BP 133/72 mmHg  Pulse 71  Temp(Src) 97.7 F (36.5 C) (Oral)  Ht 5\' 3"  (1.6 m)  Wt 173 lb 12 oz (78.812 kg)  BMI 30.79 kg/m2  SpO2 99%  LMP 10/30/2014 (Exact Date) Physical Exam  Constitutional: She is oriented to person, place, and time. She appears well-developed and well-nourished. No distress.  HENT:  Head: Normocephalic and atraumatic.  Right Ear: External ear normal.  Left Ear: External ear normal.  Nose: Nose normal.  Mouth/Throat: Oropharynx is clear and moist.  Eyes: Conjunctivae are normal. Pupils are equal, round, and reactive to light. Right eye exhibits no discharge. Left eye exhibits no discharge. No scleral icterus.  Neck: Normal range of motion. Neck supple. No tracheal deviation present. No thyromegaly present.  Cardiovascular: Normal rate, regular rhythm, normal heart sounds and intact distal pulses.  Exam reveals no gallop and no friction rub.     No murmur heard. Pulmonary/Chest: Effort normal and breath sounds normal. No accessory muscle usage. No tachypnea. No respiratory distress. She has no decreased breath sounds. She has no wheezes. She has no rhonchi. She has no rales. She exhibits no tenderness.  Musculoskeletal: Normal range of motion. She exhibits no edema or tenderness.  Lymphadenopathy:    She has no cervical adenopathy.  Neurological: She is alert and oriented to person, place, and time. No cranial nerve deficit. She exhibits normal muscle tone. Coordination normal.  Skin: Skin is warm and dry. No rash noted. She is not diaphoretic. No erythema. No pallor.  Psychiatric: She has a normal mood and affect. Her behavior is normal. Judgment and thought content normal.          Assessment & Plan:   Problem List Items Addressed This Visit      Unprioritized   Essential hypertension, benign - Primary    BP Readings from Last 3 Encounters:  11/03/14 133/72  09/17/14 146/80  08/14/14 144/90   BP well controlled on amlodipine and HCTZ. Will continue. Follow up 6 months and prn.        Return in about 6 months (around 05/05/2015) for Physical.

## 2014-11-03 NOTE — Progress Notes (Signed)
Pre visit review using our clinic review tool, if applicable. No additional management support is needed unless otherwise documented below in the visit note. 

## 2014-11-03 NOTE — Assessment & Plan Note (Signed)
BP Readings from Last 3 Encounters:  11/03/14 133/72  09/17/14 146/80  08/14/14 144/90   BP well controlled on amlodipine and HCTZ. Will continue. Follow up 6 months and prn.

## 2014-12-29 ENCOUNTER — Ambulatory Visit (INDEPENDENT_AMBULATORY_CARE_PROVIDER_SITE_OTHER): Payer: 59 | Admitting: Internal Medicine

## 2014-12-29 ENCOUNTER — Encounter: Payer: Self-pay | Admitting: Internal Medicine

## 2014-12-29 VITALS — BP 146/82 | HR 89 | Temp 98.2°F | Ht 63.0 in | Wt 176.8 lb

## 2014-12-29 DIAGNOSIS — H9319 Tinnitus, unspecified ear: Secondary | ICD-10-CM | POA: Insufficient documentation

## 2014-12-29 DIAGNOSIS — H9311 Tinnitus, right ear: Secondary | ICD-10-CM

## 2014-12-29 NOTE — Patient Instructions (Signed)
We will set up ENT evaluation today.

## 2014-12-29 NOTE — Progress Notes (Signed)
Pre visit review using our clinic review tool, if applicable. No additional management support is needed unless otherwise documented below in the visit note. 

## 2014-12-29 NOTE — Assessment & Plan Note (Signed)
Sudden onset right hearing loss and tinnitus 3 days ago. Exam is normal. Will set up hearing evaluation with ENT today.

## 2014-12-29 NOTE — Progress Notes (Signed)
   Subjective:    Patient ID: Deanna ReekKristie L Philbin, female    DOB: May 13, 1971, 44 y.o.   MRN: [id number]  HPI 44Yo female presents for acute visit.  Hearing loss and right ear tinnitis in right ear since Saturday. No fever, chills, congestion. No trauma to ear. No other symptoms.   Past medical, surgical, family and social history per today's encounter.  Review of Systems  Constitutional: Negative for fever, chills and unexpected weight change.  HENT: Positive for hearing loss and tinnitus. Negative for congestion, ear discharge, ear pain, facial swelling, mouth sores, nosebleeds, postnasal drip, rhinorrhea, sinus pressure, sneezing, sore throat, trouble swallowing and voice change.   Eyes: Negative for pain, discharge, redness and visual disturbance.  Respiratory: Negative for cough, chest tightness, shortness of breath, wheezing and stridor.   Cardiovascular: Negative for chest pain, palpitations and leg swelling.  Musculoskeletal: Negative for myalgias, arthralgias, neck pain and neck stiffness.  Skin: Negative for color change and rash.  Neurological: Negative for dizziness, weakness, light-headedness and headaches.  Hematological: Negative for adenopathy.       Objective:    BP 146/82 mmHg  Pulse 89  Temp(Src) 98.2 F (36.8 C) (Oral)  Ht 5\' 3"  (1.6 m)  Wt 176 lb 12 oz (80.173 kg)  BMI 31.32 kg/m2  SpO2 100%  LMP 12/24/2014 Physical Exam  Constitutional: She is oriented to person, place, and time. She appears well-developed and well-nourished. No distress.  HENT:  Head: Normocephalic and atraumatic.  Right Ear: Tympanic membrane and external ear normal.  Left Ear: Tympanic membrane and external ear normal.  Nose: Nose normal.  Mouth/Throat: Oropharynx is clear and moist. No oropharyngeal exudate.  Eyes: Conjunctivae are normal. Pupils are equal, round, and reactive to light. Right eye exhibits no discharge. Left eye exhibits no discharge. No scleral icterus.  Neck: Normal  range of motion. Neck supple. No tracheal deviation present. No thyromegaly present.  Cardiovascular: Normal rate, regular rhythm, normal heart sounds and intact distal pulses.  Exam reveals no gallop and no friction rub.   No murmur heard. Pulmonary/Chest: Effort normal and breath sounds normal. No respiratory distress. She has no wheezes. She has no rales. She exhibits no tenderness.  Musculoskeletal: Normal range of motion. She exhibits no edema or tenderness.  Lymphadenopathy:    She has no cervical adenopathy.  Neurological: She is alert and oriented to person, place, and time. No cranial nerve deficit. She exhibits normal muscle tone. Coordination normal.  Skin: Skin is warm and dry. No rash noted. She is not diaphoretic. No erythema. No pallor.  Psychiatric: She has a normal mood and affect. Her behavior is normal. Judgment and thought content normal.          Assessment & Plan:   Problem List Items Addressed This Visit      Unprioritized   Tinnitus aurium - Primary    Sudden onset right hearing loss and tinnitus 3 days ago. Exam is normal. Will set up hearing evaluation with ENT today.      Relevant Orders   Ambulatory referral to ENT       Return in about 4 weeks (around 01/26/2015) for Recheck.

## 2015-01-12 ENCOUNTER — Other Ambulatory Visit: Payer: Self-pay | Admitting: Internal Medicine

## 2015-01-16 ENCOUNTER — Other Ambulatory Visit: Payer: Self-pay | Admitting: *Deleted

## 2015-01-16 MED ORDER — HYDROCHLOROTHIAZIDE 12.5 MG PO CAPS: 12.5000 mg | ORAL_CAPSULE | Freq: Every day | ORAL | Status: DC

## 2015-04-04 ENCOUNTER — Other Ambulatory Visit: Payer: Self-pay | Admitting: Internal Medicine

## 2015-05-11 ENCOUNTER — Ambulatory Visit (INDEPENDENT_AMBULATORY_CARE_PROVIDER_SITE_OTHER): Payer: 59 | Admitting: Internal Medicine

## 2015-05-11 ENCOUNTER — Encounter: Payer: Self-pay | Admitting: Internal Medicine

## 2015-05-11 VITALS — BP 136/81 | HR 80 | Temp 97.7°F | Ht 63.0 in | Wt 180.2 lb

## 2015-05-11 DIAGNOSIS — E669 Obesity, unspecified: Secondary | ICD-10-CM

## 2015-05-11 DIAGNOSIS — Z0000 Encounter for general adult medical examination without abnormal findings: Secondary | ICD-10-CM | POA: Diagnosis not present

## 2015-05-11 DIAGNOSIS — R609 Edema, unspecified: Secondary | ICD-10-CM

## 2015-05-11 NOTE — Patient Instructions (Signed)
Consider using Mediven compression hose, knee-high, during the day.  Health Maintenance Adopting a healthy lifestyle and getting preventive care can go a long way to promote health and wellness. Talk with your health care provider about what schedule of regular examinations is right for you. This is a good chance for you to check in with your provider about disease prevention and staying healthy. In between checkups, there are plenty of things you can do on your own. Experts have done a lot of research about which lifestyle changes and preventive measures are most likely to keep you healthy. Ask your health care provider for more information. WEIGHT AND DIET  Eat a healthy diet  Be sure to include plenty of vegetables, fruits, low-fat dairy products, and lean protein.  Do not eat a lot of foods high in solid fats, added sugars, or salt.  Get regular exercise. This is one of the most important things you can do for your health.  Most adults should exercise for at least 150 minutes each week. The exercise should increase your heart rate and make you sweat (moderate-intensity exercise).  Most adults should also do strengthening exercises at least twice a week. This is in addition to the moderate-intensity exercise.  Maintain a healthy weight  Body mass index (BMI) is a measurement that can be used to identify possible weight problems. It estimates body fat based on height and weight. Your health care provider can help determine your BMI and help you achieve or maintain a healthy weight.  For females 23 years of age and older:   A BMI below 18.5 is considered underweight.  A BMI of 18.5 to 24.9 is normal.  A BMI of 25 to 29.9 is considered overweight.  A BMI of 30 and above is considered obese.  Watch levels of cholesterol and blood lipids  You should start having your blood tested for lipids and cholesterol at 45 years of age, then have this test every 5 years.  You may need to have  your cholesterol levels checked more often if:  Your lipid or cholesterol levels are high.  You are older than 45 years of age.  You are at high risk for heart disease.  CANCER SCREENING   Lung Cancer  Lung cancer screening is recommended for adults 81-63 years old who are at high risk for lung cancer because of a history of smoking.  A yearly low-dose CT scan of the lungs is recommended for people who:  Currently smoke.  Have quit within the past 15 years.  Have at least a 30-pack-year history of smoking. A pack year is smoking an average of one pack of cigarettes a day for 1 year.  Yearly screening should continue until it has been 15 years since you quit.  Yearly screening should stop if you develop a health problem that would prevent you from having lung cancer treatment.  Breast Cancer  Practice breast self-awareness. This means understanding how your breasts normally appear and feel.  It also means doing regular breast self-exams. Let your health care provider know about any changes, no matter how small.  If you are in your 20s or 30s, you should have a clinical breast exam (CBE) by a health care provider every 1-3 years as part of a regular health exam.  If you are 62 or older, have a CBE every year. Also consider having a breast X-ray (mammogram) every year.  If you have a family history of breast cancer, talk to your  health care provider about genetic screening.  If you are at high risk for breast cancer, talk to your health care provider about having an MRI and a mammogram every year.  Breast cancer gene (BRCA) assessment is recommended for women who have family members with BRCA-related cancers. BRCA-related cancers include:  Breast.  Ovarian.  Tubal.  Peritoneal cancers.  Results of the assessment will determine the need for genetic counseling and BRCA1 and BRCA2 testing. Cervical Cancer Routine pelvic examinations to screen for cervical cancer are no  longer recommended for nonpregnant women who are considered low risk for cancer of the pelvic organs (ovaries, uterus, and vagina) and who do not have symptoms. A pelvic examination may be necessary if you have symptoms including those associated with pelvic infections. Ask your health care provider if a screening pelvic exam is right for you.   The Pap test is the screening test for cervical cancer for women who are considered at risk.  If you had a hysterectomy for a problem that was not cancer or a condition that could lead to cancer, then you no longer need Pap tests.  If you are older than 65 years, and you have had normal Pap tests for the past 10 years, you no longer need to have Pap tests.  If you have had past treatment for cervical cancer or a condition that could lead to cancer, you need Pap tests and screening for cancer for at least 20 years after your treatment.  If you no longer get a Pap test, assess your risk factors if they change (such as having a new sexual partner). This can affect whether you should start being screened again.  Some women have medical problems that increase their chance of getting cervical cancer. If this is the case for you, your health care provider may recommend more frequent screening and Pap tests.  The human papillomavirus (HPV) test is another test that may be used for cervical cancer screening. The HPV test looks for the virus that can cause cell changes in the cervix. The cells collected during the Pap test can be tested for HPV.  The HPV test can be used to screen women 35 years of age and older. Getting tested for HPV can extend the interval between normal Pap tests from three to five years.  An HPV test also should be used to screen women of any age who have unclear Pap test results.  After 45 years of age, women should have HPV testing as often as Pap tests.  Colorectal Cancer  This type of cancer can be detected and often  prevented.  Routine colorectal cancer screening usually begins at 45 years of age and continues through 45 years of age.  Your health care provider may recommend screening at an earlier age if you have risk factors for colon cancer.  Your health care provider may also recommend using home test kits to check for hidden blood in the stool.  A small camera at the end of a tube can be used to examine your colon directly (sigmoidoscopy or colonoscopy). This is done to check for the earliest forms of colorectal cancer.  Routine screening usually begins at age 76.  Direct examination of the colon should be repeated every 5-10 years through 45 years of age. However, you may need to be screened more often if early forms of precancerous polyps or small growths are found. Skin Cancer  Check your skin from head to toe regularly.  Tell your  health care provider about any new moles or changes in moles, especially if there is a change in a mole's shape or color.  Also tell your health care provider if you have a mole that is larger than the size of a pencil eraser.  Always use sunscreen. Apply sunscreen liberally and repeatedly throughout the day.  Protect yourself by wearing long sleeves, pants, a wide-brimmed hat, and sunglasses whenever you are outside. HEART DISEASE, DIABETES, AND HIGH BLOOD PRESSURE   Have your blood pressure checked at least every 1-2 years. High blood pressure causes heart disease and increases the risk of stroke.  If you are between 73 years and 92 years old, ask your health care provider if you should take aspirin to prevent strokes.  Have regular diabetes screenings. This involves taking a blood sample to check your fasting blood sugar level.  If you are at a normal weight and have a low risk for diabetes, have this test once every three years after 45 years of age.  If you are overweight and have a high risk for diabetes, consider being tested at a younger age or more  often. PREVENTING INFECTION  Hepatitis B  If you have a higher risk for hepatitis B, you should be screened for this virus. You are considered at high risk for hepatitis B if:  You were born in a country where hepatitis B is common. Ask your health care provider which countries are considered high risk.  Your parents were born in a high-risk country, and you have not been immunized against hepatitis B (hepatitis B vaccine).  You have HIV or AIDS.  You use needles to inject street drugs.  You live with someone who has hepatitis B.  You have had sex with someone who has hepatitis B.  You get hemodialysis treatment.  You take certain medicines for conditions, including cancer, organ transplantation, and autoimmune conditions. Hepatitis C  Blood testing is recommended for:  Everyone born from [birth year] through 1966.  Anyone with known risk factors for hepatitis C. Sexually transmitted infections (STIs)  You should be screened for sexually transmitted infections (STIs) including gonorrhea and chlamydia if:  You are sexually active and are younger than 45 years of age.  You are older than 45 years of age and your health care provider tells you that you are at risk for this type of infection.  Your sexual activity has changed since you were last screened and you are at an increased risk for chlamydia or gonorrhea. Ask your health care provider if you are at risk.  If you do not have HIV, but are at risk, it may be recommended that you take a prescription medicine daily to prevent HIV infection. This is called pre-exposure prophylaxis (PrEP). You are considered at risk if:  You are sexually active and do not regularly use condoms or know the HIV status of your partner(s).  You take drugs by injection.  You are sexually active with a partner who has HIV. Talk with your health care provider about whether you are at high risk of being infected with HIV. If you choose to begin PrEP, you  should first be tested for HIV. You should then be tested every 3 months for as long as you are taking PrEP.  PREGNANCY   If you are premenopausal and you may become pregnant, ask your health care provider about preconception counseling.  If you may become pregnant, take 400 to 800 micrograms (mcg) of folic acid every day.  If you want to prevent pregnancy, talk to your health care provider about birth control (contraception). OSTEOPOROSIS AND MENOPAUSE   Osteoporosis is a disease in which the bones lose minerals and strength with aging. This can result in serious bone fractures. Your risk for osteoporosis can be identified using a bone density scan.  If you are 7 years of age or older, or if you are at risk for osteoporosis and fractures, ask your health care provider if you should be screened.  Ask your health care provider whether you should take a calcium or vitamin D supplement to lower your risk for osteoporosis.  Menopause may have certain physical symptoms and risks.  Hormone replacement therapy may reduce some of these symptoms and risks. Talk to your health care provider about whether hormone replacement therapy is right for you.  HOME CARE INSTRUCTIONS   Schedule regular health, dental, and eye exams.  Stay current with your immunizations.   Do not use any tobacco products including cigarettes, chewing tobacco, or electronic cigarettes.  If you are pregnant, do not drink alcohol.  If you are breastfeeding, limit how much and how often you drink alcohol.  Limit alcohol intake to no more than 1 drink per day for nonpregnant women. One drink equals 12 ounces of beer, 5 ounces of wine, or 1 ounces of hard liquor.  Do not use street drugs.  Do not share needles.  Ask your health care provider for help if you need support or information about quitting drugs.  Tell your health care provider if you often feel depressed.  Tell your health care provider if you have ever  been abused or do not feel safe at home. Document Released: 05/30/2011 Document Revised: 03/31/2014 Document Reviewed: 10/16/2013 [facility] Patient Information 2015 [place], [place]. This information is not intended to replace advice given to you by your health care provider. Make sure you discuss any questions you have with your health care provider.

## 2015-05-11 NOTE — Progress Notes (Signed)
Pre visit review using our clinic review tool, if applicable. No additional management support is needed unless otherwise documented below in the visit note. 

## 2015-05-11 NOTE — Assessment & Plan Note (Signed)
General medical exam normal today. Pelvic and breast exam deferred as recently completed by her GYN. Mammogram UTD. Labs as ordered. Immunizations are UTD. Encouraged healthy diet and exercise.

## 2015-05-11 NOTE — Assessment & Plan Note (Signed)
Wt Readings from Last 3 Encounters:  05/11/15 180 lb 4 oz (81.761 kg)  12/29/14 176 lb 12 oz (80.173 kg)  11/03/14 173 lb 12 oz (78.812 kg)   Body mass index is 31.94 kg/(m^2). Encouraged healthy diet and exercise. Encouraged Mediterranean style diet.

## 2015-05-11 NOTE — Progress Notes (Signed)
Subjective:    Patient ID: Deanna Schultz, female    DOB: 12-05-1970, 44 y.o.   MRN: [id number]  HPI  44YO female presents for annual exam.  Concerned about right>left ankle swelling over last few weeks. Taking HCTZ. Trying to increase water intake, and limit salt intake. Right ankle is swelling in the evening. Sometimes, pitting. Worse in warmer months. Improved overnight. Not painful.  Exercising on a regular basis, by walking. Not following any diet program. Frustrated by inability to lose weight.   Wt Readings from Last 3 Encounters:  05/11/15 180 lb 4 oz (81.761 kg)  12/29/14 176 lb 12 oz (80.173 kg)  11/03/14 173 lb 12 oz (78.812 kg)    Past medical, surgical, family and social history per today's encounter.  Review of Systems  Constitutional: Negative for fever, chills, appetite change, fatigue and unexpected weight change.  Eyes: Negative for visual disturbance.  Respiratory: Negative for shortness of breath.   Cardiovascular: Positive for leg swelling. Negative for chest pain.  Gastrointestinal: Negative for nausea, vomiting, abdominal pain, diarrhea and constipation.  Musculoskeletal: Negative for myalgias, joint swelling and arthralgias.  Skin: Negative for color change and rash.  Hematological: Negative for adenopathy. Does not bruise/bleed easily.  Psychiatric/Behavioral: Negative for suicidal ideas, sleep disturbance and dysphoric mood. The patient is not nervous/anxious.        Objective:    BP 136/81 mmHg  Pulse 80  Temp(Src) 97.7 F (36.5 C) (Oral)  Ht 5\' 3"  (1.6 m)  Wt 180 lb 4 oz (81.761 kg)  BMI 31.94 kg/m2  SpO2 99%  LMP 05/11/2015 Physical Exam  Constitutional: She is oriented to person, place, and time. She appears well-developed and well-nourished. No distress.  HENT:  Head: Normocephalic and atraumatic.  Right Ear: External ear normal.  Left Ear: External ear normal.  Nose: Nose normal.  Mouth/Throat: Oropharynx is clear and moist. No  oropharyngeal exudate.  Eyes: Conjunctivae and EOM are normal. Pupils are equal, round, and reactive to light. Right eye exhibits no discharge.  Neck: Normal range of motion. Neck supple. No thyromegaly present.  Cardiovascular: Normal rate, regular rhythm, normal heart sounds and intact distal pulses.  Exam reveals no gallop and no friction rub.   No murmur heard. Pulmonary/Chest: Effort normal. No respiratory distress. She has no wheezes. She has no rales.  Abdominal: Soft. Bowel sounds are normal. She exhibits no distension and no mass. There is no tenderness. There is no rebound and no guarding.  Musculoskeletal: Normal range of motion. She exhibits no edema or tenderness.  Lymphadenopathy:    She has no cervical adenopathy.  Neurological: She is alert and oriented to person, place, and time. No cranial nerve deficit. Coordination normal.  Skin: Skin is warm and dry. No rash noted. She is not diaphoretic. No erythema. No pallor.  Psychiatric: She has a normal mood and affect. Her behavior is normal. Judgment and thought content normal.          Assessment & Plan:   Problem List Items Addressed This Visit      Unprioritized   Edema    Edema right leg>left leg noted by pt in the evenings. Likely venous insufficiency. Encouraged her to wear compression stockings, keep legs elevated, and limit salt intake. Will set up venous dopplers to evaluate for venous insufficiency.      Relevant Orders   Ambulatory referral to Vascular Surgery   Obesity (BMI 30-39.9)    Wt Readings from Last 3 Encounters:  05/11/15 180  lb 4 oz (81.761 kg)  12/29/14 176 lb 12 oz (80.173 kg)  11/03/14 173 lb 12 oz (78.812 kg)   Body mass index is 31.94 kg/(m^2). Encouraged healthy diet and exercise. Encouraged Mediterranean style diet.      Routine general medical examination at a health care facility - Primary    General medical exam normal today. Pelvic and breast exam deferred as recently completed by  her GYN. Mammogram UTD. Labs as ordered. Immunizations are UTD. Encouraged healthy diet and exercise.      Relevant Orders   CBC with Differential/Platelet   Comprehensive metabolic panel   Lipid panel   Hemoglobin A1c   TSH   Microalbumin / creatinine urine ratio   Vit D  25 hydroxy (rtn osteoporosis monitoring)       Return in about 3 months (around 08/11/2015) for Recheck.

## 2015-05-11 NOTE — Assessment & Plan Note (Signed)
Edema right leg>left leg noted by pt in the evenings. Likely venous insufficiency. Encouraged her to wear compression stockings, keep legs elevated, and limit salt intake. Will set up venous dopplers to evaluate for venous insufficiency.

## 2015-05-19 ENCOUNTER — Other Ambulatory Visit (INDEPENDENT_AMBULATORY_CARE_PROVIDER_SITE_OTHER): Payer: 59

## 2015-05-19 DIAGNOSIS — Z0000 Encounter for general adult medical examination without abnormal findings: Secondary | ICD-10-CM

## 2015-05-19 LAB — CBC WITH DIFFERENTIAL/PLATELET
BASOS PCT: 0.3 % (ref 0.0–3.0)
Basophils Absolute: 0 10*3/uL (ref 0.0–0.1)
EOS PCT: 1.3 % (ref 0.0–5.0)
Eosinophils Absolute: 0.1 10*3/uL (ref 0.0–0.7)
HCT: 41.5 % (ref 36.0–46.0)
Hemoglobin: 14 g/dL (ref 12.0–15.0)
LYMPHS PCT: 28.3 % (ref 12.0–46.0)
Lymphs Abs: 2 10*3/uL (ref 0.7–4.0)
MCHC: 33.7 g/dL (ref 30.0–36.0)
MCV: 93.7 fl (ref 78.0–100.0)
MONOS PCT: 11 % (ref 3.0–12.0)
Monocytes Absolute: 0.8 10*3/uL (ref 0.1–1.0)
NEUTROS PCT: 59.1 % (ref 43.0–77.0)
Neutro Abs: 4.1 10*3/uL (ref 1.4–7.7)
PLATELETS: 223 10*3/uL (ref 150.0–400.0)
RBC: 4.43 Mil/uL (ref 3.87–5.11)
RDW: 13.2 % (ref 11.5–15.5)
WBC: 6.9 10*3/uL (ref 4.0–10.5)

## 2015-05-19 LAB — COMPREHENSIVE METABOLIC PANEL
ALK PHOS: 50 U/L (ref 39–117)
ALT: 31 U/L (ref 0–35)
AST: 24 U/L (ref 0–37)
Albumin: 4.2 g/dL (ref 3.5–5.2)
BUN: 10 mg/dL (ref 6–23)
CALCIUM: 9.3 mg/dL (ref 8.4–10.5)
CO2: 27 mEq/L (ref 19–32)
Chloride: 101 mEq/L (ref 96–112)
Creatinine, Ser: 0.59 mg/dL (ref 0.40–1.20)
GFR: 117.33 mL/min (ref >60.00)
Glucose, Bld: 78 mg/dL (ref 70–99)
POTASSIUM: 3.9 meq/L (ref 3.5–5.1)
Sodium: 136 mEq/L (ref 135–145)
Total Bilirubin: 0.4 mg/dL (ref 0.2–1.2)
Total Protein: 7.3 g/dL (ref 6.0–8.3)

## 2015-05-19 LAB — LIPID PANEL
CHOL/HDL RATIO: 4
CHOLESTEROL: 206 mg/dL — AB (ref 0–200)
HDL: 56.1 mg/dL (ref >39.00)
LDL CALC: 129 mg/dL — AB (ref 0–99)
NonHDL: 149.9
TRIGLYCERIDES: 104 mg/dL (ref 0.0–149.0)
VLDL: 20.8 mg/dL (ref 0.0–40.0)

## 2015-05-19 LAB — TSH: TSH: 1.06 u[IU]/mL (ref 0.35–4.50)

## 2015-05-19 LAB — VITAMIN D 25 HYDROXY (VIT D DEFICIENCY, FRACTURES): VITD: 28.25 ng/mL — AB (ref 30.00–100.00)

## 2015-05-19 LAB — MICROALBUMIN / CREATININE URINE RATIO
Creatinine,U: 50.8 mg/dL
MICROALB/CREAT RATIO: 1.4 mg/g (ref 0.0–30.0)
Microalb, Ur: <0.7 mg/dL (ref 0.0–1.9)

## 2015-05-19 LAB — HEMOGLOBIN A1C: Hgb A1c MFr Bld: 5.5 % (ref 4.6–6.5)

## 2015-05-29 ENCOUNTER — Ambulatory Visit (INDEPENDENT_AMBULATORY_CARE_PROVIDER_SITE_OTHER): Payer: 59 | Admitting: Nurse Practitioner

## 2015-05-29 ENCOUNTER — Encounter: Payer: Self-pay | Admitting: Nurse Practitioner

## 2015-05-29 VITALS — BP 130/78 | HR 96 | Temp 97.3°F | Resp 16 | Ht 63.0 in | Wt 181.4 lb

## 2015-05-29 DIAGNOSIS — J0140 Acute pansinusitis, unspecified: Secondary | ICD-10-CM

## 2015-05-29 DIAGNOSIS — J019 Acute sinusitis, unspecified: Secondary | ICD-10-CM | POA: Insufficient documentation

## 2015-05-29 MED ORDER — AMOXICILLIN-POT CLAVULANATE 875-125 MG PO TABS
1.0000 | ORAL_TABLET | Freq: Two times a day (BID) | ORAL | Status: DC
Start: 2015-05-29 — End: 2015-08-13

## 2015-05-29 NOTE — Progress Notes (Signed)
Pre visit review using our clinic review tool, if applicable. No additional management support is needed unless otherwise documented below in the visit note. 

## 2015-05-29 NOTE — Patient Instructions (Signed)
Please take the antibiotic as prescribed.   Please take a probiotic ( Align, Floraque or Culturelle) while you are on the antibiotic to prevent a serious antibiotic associated diarrhea  Called clostirudium dificile colitis and a vaginal yeast infection.   Call us if no improvement next week. Still can take mucinex ect... While on antibiotics.

## 2015-05-29 NOTE — Progress Notes (Signed)
   Subjective:    Patient ID: Deanna Schultz, female    DOB: [birth year], 44 y.o.   MRN: [id number]  HPI  Deanna Schultz is a 44 yo female with a CC of sinusitis x 8 days.   1)  Sweats this morning, maxillary pressure > frontal pressure, right side worse than left, dental work on left yesterday. Bloody green discharge from nose, worsening over the 8 days today worst day so far. Congestion, pressure, headache, face pain. Tylenol sinus  Guaifenesin   Review of Systems  Constitutional: Positive for diaphoresis. Negative for fever, chills and fatigue.  HENT: Positive for congestion, rhinorrhea, sinus pressure and sneezing. Negative for postnasal drip, sore throat, tinnitus and trouble swallowing.   Eyes: Positive for discharge. Negative for pain, redness, itching and visual disturbance.  Respiratory: Negative for cough, chest tightness and wheezing.   Cardiovascular: Negative for chest pain, palpitations and leg swelling.  Gastrointestinal: Negative for nausea, vomiting and diarrhea.  Skin: Negative for rash.  Neurological: Positive for headaches. Negative for dizziness.      Objective:   Physical Exam  Constitutional: She is oriented to person, place, and time. She appears well-developed and well-nourished. No distress.  BP 130/78 mmHg  Pulse 96  Temp(Src) 97.3 F (36.3 C)  Resp 16  Ht 5\' 3"  (1.6 m)  Wt 181 lb 6.4 oz (82.283 kg)  BMI 32.14 kg/m2  SpO2 98%  LMP 05/11/2015   HENT:  Head: Normocephalic and atraumatic.  Right Ear: External ear normal.  Left Ear: External ear normal.  Mouth/Throat: No oropharyngeal exudate.  TM's clear bilaterally Large tonsils- noted in past, no exudates visualized Facial pain with palpation  Eyes: EOM are normal. Pupils are equal, round, and reactive to light. Right eye exhibits no discharge. Left eye exhibits no discharge. No scleral icterus.  Neck: Normal range of motion. Neck supple.  Cardiovascular: Normal rate, regular rhythm, normal heart  sounds and intact distal pulses.  Exam reveals no gallop and no friction rub.   No murmur heard. Pulmonary/Chest: Effort normal and breath sounds normal. No respiratory distress. She has no wheezes. She has no rales. She exhibits no tenderness.  Lymphadenopathy:    She has cervical adenopathy.  Neurological: She is alert and oriented to person, place, and time. No cranial nerve deficit. She exhibits normal muscle tone. Coordination normal.  Skin: Skin is warm and dry. No rash noted. She is not diaphoretic.  Psychiatric: She has a normal mood and affect. Her behavior is normal. Judgment and thought content normal.      Assessment & Plan:

## 2015-05-29 NOTE — Assessment & Plan Note (Signed)
Pt worsening over 8 days without relief from OTC medications. Pt visibly uncomfortable today and very congested. Start Augmentin twice daily x 7 days, encouraged probiotics, continue OTC medications as needed. FU prn worsening/failure to improve.

## 2015-08-02 ENCOUNTER — Other Ambulatory Visit: Payer: Self-pay | Admitting: Internal Medicine

## 2015-08-13 ENCOUNTER — Ambulatory Visit (INDEPENDENT_AMBULATORY_CARE_PROVIDER_SITE_OTHER): Payer: 59 | Admitting: Internal Medicine

## 2015-08-13 ENCOUNTER — Encounter: Payer: Self-pay | Admitting: Internal Medicine

## 2015-08-13 VITALS — BP 130/78 | HR 81 | Temp 98.1°F | Ht 63.0 in | Wt 178.5 lb

## 2015-08-13 DIAGNOSIS — E669 Obesity, unspecified: Secondary | ICD-10-CM

## 2015-08-13 DIAGNOSIS — Z23 Encounter for immunization: Secondary | ICD-10-CM | POA: Diagnosis not present

## 2015-08-13 DIAGNOSIS — I10 Essential (primary) hypertension: Secondary | ICD-10-CM | POA: Diagnosis not present

## 2015-08-13 NOTE — Assessment & Plan Note (Signed)
Wt Readings from Last 3 Encounters:  08/13/15 178 lb 8 oz (80.967 kg)  05/29/15 181 lb 6.4 oz (82.283 kg)  05/11/15 180 lb 4 oz (81.761 kg)   Body mass index is 31.63 kg/(m^2). Encouraged healthy diet and exercise.

## 2015-08-13 NOTE — Progress Notes (Signed)
Pre visit review using our clinic review tool, if applicable. No additional management support is needed unless otherwise documented below in the visit note. 

## 2015-08-13 NOTE — Patient Instructions (Signed)
Follow up next June for physical.

## 2015-08-13 NOTE — Progress Notes (Signed)
Subjective:    Patient ID: Deanna Schultz, female    DOB: 1971-08-24, 44 y.o.   MRN: [id number]  HPI  44YO female presents for follow up.  Exercising and writing down food diary.  HTN - Compliant with medication. Does not check BP at home. No CP or palp.  Wt Readings from Last 3 Encounters:  08/13/15 178 lb 8 oz (80.967 kg)  05/29/15 181 lb 6.4 oz (82.283 kg)  05/11/15 180 lb 4 oz (81.761 kg)   BP Readings from Last 3 Encounters:  08/13/15 130/78  05/29/15 130/78  05/11/15 136/81     Past Medical History  Diagnosis Date  . Placenta previa   . Hyperlipemia    Family History  Problem Relation Age of Onset  . Hyperlipidemia Mother   . Hypertension Mother   . Diabetes Mother   . Hypertension Father   . Heart disease Father   . Hyperlipidemia Father   . Arthritis Father   . Cancer Brother 25    Brain Tumor, GBM  . Heart disease Maternal Grandfather   . Hypertension Brother    Past Surgical History  Procedure Laterality Date  . Vaginal delivery      x 2  . Dilation and curettage of uterus  06/2002    due to miscarriage  . Cesarean section      x 1    Social History   Social History  . Marital Status: Married    Spouse Name: N/A  . Number of Children: 3  . Years of Education: N/A   Occupational History  . Stay at home mom    Social History Main Topics  . Smoking status: Never Smoker   . Smokeless tobacco: Never Used  . Alcohol Use: 0.0 oz/week    0 Standard drinks or equivalent per week  . Drug Use: No  . Sexual Activity: Not Asked   Other Topics Concern  . None   Social History Narrative   Lives in [place] with husband and 3 children.  Bird, Dog, and Cat in house. Homemaker.      Regular Exercise -  YES, 5 days a week   Daily Caffeine Use:  4 cups coffee             Review of Systems  Constitutional: Negative for fever, chills, appetite change, fatigue and unexpected weight change.  Eyes: Negative for visual disturbance.    Respiratory: Negative for shortness of breath.   Cardiovascular: Negative for chest pain, palpitations and leg swelling.  Gastrointestinal: Negative for abdominal pain.  Skin: Negative for color change and rash.  Hematological: Negative for adenopathy. Does not bruise/bleed easily.  Psychiatric/Behavioral: Negative for dysphoric mood. The patient is not nervous/anxious.        Objective:    BP 130/78 mmHg  Pulse 81  Temp(Src) 98.1 F (36.7 C) (Oral)  Ht 5\' 3"  (1.6 m)  Wt 178 lb 8 oz (80.967 kg)  BMI 31.63 kg/m2  SpO2 99%  LMP 07/29/2015 Physical Exam  Constitutional: She is oriented to person, place, and time. She appears well-developed and well-nourished. No distress.  HENT:  Head: Normocephalic and atraumatic.  Right Ear: External ear normal.  Left Ear: External ear normal.  Nose: Nose normal.  Mouth/Throat: Oropharynx is clear and moist. No oropharyngeal exudate.  Eyes: Conjunctivae are normal. Pupils are equal, round, and reactive to light. Right eye exhibits no discharge. Left eye exhibits no discharge. No scleral icterus.  Neck: Normal range of motion. Neck  supple. No tracheal deviation present. No thyromegaly present.  Cardiovascular: Normal rate, regular rhythm, normal heart sounds and intact distal pulses.  Exam reveals no gallop and no friction rub.   No murmur heard. Pulmonary/Chest: Effort normal and breath sounds normal. No respiratory distress. She has no wheezes. She has no rales. She exhibits no tenderness.  Musculoskeletal: Normal range of motion. She exhibits no edema or tenderness.  Lymphadenopathy:    She has no cervical adenopathy.  Neurological: She is alert and oriented to person, place, and time. No cranial nerve deficit. She exhibits normal muscle tone. Coordination normal.  Skin: Skin is warm and dry. No rash noted. She is not diaphoretic. No erythema. No pallor.  Psychiatric: She has a normal mood and affect. Her behavior is normal. Judgment and  thought content normal.          Assessment & Plan:   Problem List Items Addressed This Visit      Unprioritized   Essential hypertension, benign - Primary    BP Readings from Last 3 Encounters:  08/13/15 130/78  05/29/15 130/78  05/11/15 136/81   BP well controlled. Continue current medications. Recent renal function normal.      Obesity (BMI 30-39.9)    Wt Readings from Last 3 Encounters:  08/13/15 178 lb 8 oz (80.967 kg)  05/29/15 181 lb 6.4 oz (82.283 kg)  05/11/15 180 lb 4 oz (81.761 kg)   Body mass index is 31.63 kg/(m^2). Encouraged healthy diet and exercise.          Return in about 9 months (around 05/12/2016) for Physical.

## 2015-08-13 NOTE — Assessment & Plan Note (Signed)
BP Readings from Last 3 Encounters:  08/13/15 130/78  05/29/15 130/78  05/11/15 136/81   BP well controlled. Continue current medications. Recent renal function normal.

## 2015-08-27 ENCOUNTER — Other Ambulatory Visit: Payer: Self-pay | Admitting: Internal Medicine

## 2015-10-04 ENCOUNTER — Other Ambulatory Visit: Payer: Self-pay | Admitting: Internal Medicine

## 2015-10-05 ENCOUNTER — Encounter: Payer: Self-pay | Admitting: Internal Medicine

## 2015-12-26 ENCOUNTER — Other Ambulatory Visit: Payer: Self-pay | Admitting: Internal Medicine

## 2016-01-27 ENCOUNTER — Other Ambulatory Visit: Payer: Self-pay | Admitting: Internal Medicine

## 2016-02-17 ENCOUNTER — Other Ambulatory Visit: Payer: Self-pay

## 2016-02-17 MED ORDER — LINACLOTIDE 145 MCG PO CAPS: 145.0000 ug | ORAL_CAPSULE | Freq: Every day | ORAL | Status: DC

## 2016-02-17 NOTE — Telephone Encounter (Signed)
Pt switched pharmacy to [facility]

## 2016-04-21 ENCOUNTER — Other Ambulatory Visit: Payer: Self-pay | Admitting: Internal Medicine

## 2016-05-12 ENCOUNTER — Encounter: Payer: 59 | Admitting: Internal Medicine

## 2016-05-22 ENCOUNTER — Other Ambulatory Visit: Payer: Self-pay | Admitting: Internal Medicine

## 2016-06-20 ENCOUNTER — Ambulatory Visit (INDEPENDENT_AMBULATORY_CARE_PROVIDER_SITE_OTHER): Payer: 59 | Admitting: Internal Medicine

## 2016-06-20 ENCOUNTER — Encounter: Payer: Self-pay | Admitting: Internal Medicine

## 2016-06-20 VITALS — BP 136/86 | HR 66 | Wt 180.6 lb

## 2016-06-20 DIAGNOSIS — K581 Irritable bowel syndrome with constipation: Secondary | ICD-10-CM

## 2016-06-20 DIAGNOSIS — I10 Essential (primary) hypertension: Secondary | ICD-10-CM | POA: Diagnosis not present

## 2016-06-20 LAB — COMPREHENSIVE METABOLIC PANEL
ALBUMIN: 4.4 g/dL (ref 3.5–5.2)
ALK PHOS: 51 U/L (ref 39–117)
ALT: 23 U/L (ref 0–35)
AST: 20 U/L (ref 0–37)
BILIRUBIN TOTAL: 0.4 mg/dL (ref 0.2–1.2)
BUN: 11 mg/dL (ref 6–23)
CALCIUM: 9.7 mg/dL (ref 8.4–10.5)
CO2: 28 meq/L (ref 19–32)
CREATININE: 0.6 mg/dL (ref 0.40–1.20)
Chloride: 103 mEq/L (ref 96–112)
GFR: 114.52 mL/min (ref >60.00)
Glucose, Bld: 89 mg/dL (ref 70–99)
Potassium: 3.9 mEq/L (ref 3.5–5.1)
Sodium: 137 mEq/L (ref 135–145)
TOTAL PROTEIN: 7.7 g/dL (ref 6.0–8.3)

## 2016-06-20 MED ORDER — HYDROCHLOROTHIAZIDE 12.5 MG PO CAPS: ORAL_CAPSULE | ORAL | 3 refills | Status: DC

## 2016-06-20 MED ORDER — AMLODIPINE BESYLATE 5 MG PO TABS: 5.0000 mg | ORAL_TABLET | Freq: Every day | ORAL | 3 refills | Status: DC

## 2016-06-20 NOTE — Progress Notes (Signed)
Subjective:    Patient ID: Deanna Schultz, female    DOB: [birth year], 45 y.o.   MRN: [id number]  HPI  45YO female presents for follow up.  HTN - BP well controlled at home. Compliant with medications. No CP, HA, palpitations  IBS with constipation - Symptoms well controlled with Linzess. No concerns today.    Wt Readings from Last 3 Encounters:  06/20/16 180 lb 9.6 oz (81.9 kg)  08/13/15 178 lb 8 oz (81 kg)  05/29/15 181 lb 6.4 oz (82.3 kg)   BP Readings from Last 3 Encounters:  06/20/16 136/86  08/13/15 130/78  05/29/15 130/78    Past Medical History:  Diagnosis Date  . Hyperlipemia   . Placenta previa    Family History  Problem Relation Age of Onset  . Hyperlipidemia Mother   . Hypertension Mother   . Diabetes Mother   . Hypertension Father   . Heart disease Father   . Hyperlipidemia Father   . Arthritis Father   . Cancer Brother 25    Brain Tumor, GBM  . Heart disease Maternal Grandfather   . Hypertension Brother    Past Surgical History:  Procedure Laterality Date  . CESAREAN SECTION     x 1   . DILATION AND CURETTAGE OF UTERUS  06/2002   due to miscarriage  . VAGINAL DELIVERY     x 2   Social History   Social History  . Marital status: Married    Spouse name: N/A  . Number of children: 3  . Years of education: N/A   Occupational History  . Stay at home mom    Social History Main Topics  . Smoking status: Never Smoker  . Smokeless tobacco: Never Used  . Alcohol use 0.0 oz/week  . Drug use: No  . Sexual activity: Not Asked   Other Topics Concern  . None   Social History Narrative   Lives in [place] with husband and 3 children.  Bird, Dog, and Cat in house. Homemaker.      Regular Exercise -  YES, 5 days a week   Daily Caffeine Use:  4 cups coffee             Review of Systems  Constitutional: Negative for appetite change, chills, fatigue, fever and unexpected weight change.  Eyes: Negative for visual disturbance.    Respiratory: Negative for shortness of breath.   Cardiovascular: Negative for chest pain, palpitations and leg swelling.  Gastrointestinal: Negative for abdominal pain, constipation, diarrhea, nausea and vomiting.  Skin: Negative for color change and rash.  Hematological: Negative for adenopathy. Does not bruise/bleed easily.  Psychiatric/Behavioral: Negative for dysphoric mood. The patient is not nervous/anxious.        Objective:    BP 136/86 (BP Location: Left Arm, Patient Position: Sitting, Cuff Size: Large)   Pulse 66   Wt 180 lb 9.6 oz (81.9 kg)   LMP 06/06/2016 (Exact Date)   SpO2 100%   BMI 31.99 kg/m  Physical Exam  Constitutional: She is oriented to person, place, and time. She appears well-developed and well-nourished. No distress.  HENT:  Head: Normocephalic and atraumatic.  Right Ear: External ear normal.  Left Ear: External ear normal.  Nose: Nose normal.  Mouth/Throat: Oropharynx is clear and moist. No oropharyngeal exudate.  Eyes: Conjunctivae are normal. Pupils are equal, round, and reactive to light. Right eye exhibits no discharge. Left eye exhibits no discharge. No scleral icterus.  Neck: Normal range of motion.  Neck supple. No tracheal deviation present. No thyromegaly present.  Cardiovascular: Normal rate, regular rhythm, normal heart sounds and intact distal pulses.  Exam reveals no gallop and no friction rub.   No murmur heard. Pulmonary/Chest: Effort normal and breath sounds normal. No respiratory distress. She has no wheezes. She has no rales. She exhibits no tenderness.  Musculoskeletal: Normal range of motion. She exhibits no edema or tenderness.  Lymphadenopathy:    She has no cervical adenopathy.  Neurological: She is alert and oriented to person, place, and time. No cranial nerve deficit. She exhibits normal muscle tone. Coordination normal.  Skin: Skin is warm and dry. No rash noted. She is not diaphoretic. No erythema. No pallor.  Psychiatric:  She has a normal mood and affect. Her behavior is normal. Judgment and thought content normal.          Assessment & Plan:   Problem List Items Addressed This Visit      Unprioritized   Essential hypertension, benign - Primary (Chronic)    BP Readings from Last 3 Encounters:  06/20/16 136/86  08/13/15 130/78  05/29/15 130/78   BP well controlled generally, however higher in office typically than at home. Renal function with labs today. Continue Amlodipine and HCTZ.      Relevant Medications   hydrochlorothiazide (MICROZIDE) 12.5 MG capsule   amLODipine (NORVASC) 5 MG tablet   Other Relevant Orders   Comp Met (CMET)   Irritable bowel syndrome with constipation (Chronic)    Symptoms well controlled on Linzess. Will continue.       Other Visit Diagnoses   None.      Return in about 6 months (around 12/21/2016) for New Patient.  Ronette Deter, MD Internal Medicine [facility]

## 2016-06-20 NOTE — Assessment & Plan Note (Signed)
Symptoms well controlled on Linzess. Will continue.

## 2016-06-20 NOTE — Assessment & Plan Note (Signed)
BP Readings from Last 3 Encounters:  06/20/16 136/86  08/13/15 130/78  05/29/15 130/78   BP well controlled generally, however higher in office typically than at home. Renal function with labs today. Continue Amlodipine and HCTZ.

## 2016-06-20 NOTE — Progress Notes (Signed)
Pre visit review using our clinic review tool, if applicable. No additional management support is needed unless otherwise documented below in the visit note. 

## 2016-06-20 NOTE — Patient Instructions (Signed)
Labs today.  Follow up in 6 months. 

## 2016-12-08 ENCOUNTER — Encounter: Payer: Self-pay | Admitting: Obstetrics & Gynecology

## 2016-12-21 ENCOUNTER — Encounter: Payer: Self-pay | Admitting: Family

## 2016-12-21 ENCOUNTER — Ambulatory Visit (INDEPENDENT_AMBULATORY_CARE_PROVIDER_SITE_OTHER): Payer: 59 | Admitting: Family

## 2016-12-21 VITALS — BP 140/80 | HR 72 | Temp 98.8°F | Ht 63.0 in | Wt 180.6 lb

## 2016-12-21 DIAGNOSIS — I10 Essential (primary) hypertension: Secondary | ICD-10-CM | POA: Diagnosis not present

## 2016-12-21 DIAGNOSIS — K581 Irritable bowel syndrome with constipation: Secondary | ICD-10-CM

## 2016-12-21 NOTE — Patient Instructions (Addendum)
Fasting lab appt- make at check out  Pleasure meeting you.

## 2016-12-21 NOTE — Assessment & Plan Note (Signed)
Stable  Continue current regimen  

## 2016-12-21 NOTE — Progress Notes (Signed)
Subjective:    Patient ID: Deanna Schultz, female    DOB: March 26, 1971, 46 y.o.   MRN: [id number]  CC: LEITH HEDLUND is a 46 y.o. female who presents today for follow up.   HPI:  IBS- Stable. Constipation type. [place] working well.   HTN- Compliant with medication. Denies exertional chest pain or pressure, numbness or tingling radiating to left arm or jaw, palpitations, dizziness, frequent headaches, changes in vision, or shortness of breath.    Follows with [facility] Dr. Dellis Filbert, mammogram and pap normal 11/2016. No records    HISTORY:  Past Medical History:  Diagnosis Date  . Hyperlipemia   . Placenta previa    Past Surgical History:  Procedure Laterality Date  . CESAREAN SECTION     x 1   . DILATION AND CURETTAGE OF UTERUS  06/2002   due to miscarriage  . VAGINAL DELIVERY     x 2   Family History  Problem Relation Age of Onset  . Hyperlipidemia Mother   . Hypertension Mother   . Diabetes Mother   . Hypertension Father   . Heart disease Father   . Hyperlipidemia Father   . Arthritis Father   . Cancer Brother 25    Brain Tumor, GBM  . Heart disease Maternal Grandfather   . Hypertension Brother     Allergies: Patient has no known allergies. Current Outpatient Prescriptions on File Prior to Visit  Medication Sig Dispense Refill  . amLODipine (NORVASC) 5 MG tablet Take 1 tablet (5 mg total) by mouth daily. 90 tablet 3  . hydrochlorothiazide (MICROZIDE) 12.5 MG capsule TAKE ONE CAPSULE BY MOUTH ONE TIME DAILY. INS MAX 30 DAYS. 90 capsule 3  . LINZESS 145 MCG CAPS capsule Take 1 capsule by mouth  daily 90 capsule 3  . Omega-3 Fatty Acids (FISH OIL PO) Take by mouth.    Earney Navy Bicarbonate (ZEGERID) 20-1100 MG CAPS Take 1 capsule by mouth daily before breakfast. 28 each 3   No current facility-administered medications on file prior to visit.     Social History  Substance Use Topics  . Smoking status: Never Smoker  . Smokeless tobacco: Never Used    . Alcohol use 0.0 oz/week    Review of Systems  Constitutional: Negative for chills and fever.  Respiratory: Negative for cough.   Cardiovascular: Negative for chest pain and palpitations.  Gastrointestinal: Positive for constipation. Negative for nausea and vomiting.      Objective:    BP 140/80   Pulse 72   Temp 98.8 F (37.1 C) (Oral)   Ht 5\' 3"  (1.6 m)   Wt 180 lb 9.6 oz (81.9 kg)   LMP 12/03/2016   SpO2 98%   BMI 31.99 kg/m  BP Readings from Last 3 Encounters:  12/21/16 140/80  06/20/16 136/86  08/13/15 130/78   Wt Readings from Last 3 Encounters:  12/21/16 180 lb 9.6 oz (81.9 kg)  06/20/16 180 lb 9.6 oz (81.9 kg)  08/13/15 178 lb 8 oz (81 kg)    Physical Exam  Constitutional: She appears well-developed and well-nourished.  Eyes: Conjunctivae are normal.  Cardiovascular: Normal rate, regular rhythm, normal heart sounds and normal pulses.   Pulmonary/Chest: Effort normal and breath sounds normal. She has no wheezes. She has no rhonchi. She has no rales.  Neurological: She is alert.  Skin: Skin is warm and dry.  Psychiatric: She has a normal mood and affect. Her speech is normal and behavior is normal. Thought content  normal.  Vitals reviewed.      Assessment & Plan:   Problem List Items Addressed This Visit      Cardiovascular and Mediastinum   Essential hypertension, benign - Primary (Chronic)    Controlled. Continue current regimen. Pending CPE labs.       Relevant Orders   CBC with Differential/Platelet   Comprehensive metabolic panel   Hemoglobin A1c   Lipid panel   TSH   VITAMIN D 25 Hydroxy (Vit-D Deficiency, Fractures)     Digestive   Irritable bowel syndrome with constipation (Chronic)    Stable. Continue current regimen.           I am having Ms. Darko maintain her Omeprazole-Sodium Bicarbonate, Omega-3 Fatty Acids (FISH OIL PO), LINZESS, hydrochlorothiazide, and amLODipine.   No orders of the defined types were placed in this  encounter.   Return precautions given.   Risks, benefits, and alternatives of the medications and treatment plan prescribed today were discussed, and patient expressed understanding.   Education regarding symptom management and diagnosis given to patient on AVS.  Continue to follow with Mable Paris, FNP for routine health maintenance.   Deanna Schultz and I agreed with plan.   Mable Paris, FNP

## 2016-12-21 NOTE — Assessment & Plan Note (Signed)
Controlled. Continue current regimen. Pending CPE labs.

## 2016-12-21 NOTE — Progress Notes (Signed)
Pre visit review using our clinic review tool, if applicable. No additional management support is needed unless otherwise documented below in the visit note. 

## 2016-12-28 ENCOUNTER — Other Ambulatory Visit (INDEPENDENT_AMBULATORY_CARE_PROVIDER_SITE_OTHER): Payer: 59

## 2016-12-28 DIAGNOSIS — I10 Essential (primary) hypertension: Secondary | ICD-10-CM | POA: Diagnosis not present

## 2016-12-28 LAB — COMPREHENSIVE METABOLIC PANEL
ALT: 16 U/L (ref 0–35)
AST: 16 U/L (ref 0–37)
Albumin: 4.4 g/dL (ref 3.5–5.2)
Alkaline Phosphatase: 46 U/L (ref 39–117)
BUN: 15 mg/dL (ref 6–23)
CHLORIDE: 103 meq/L (ref 96–112)
CO2: 24 mEq/L (ref 19–32)
CREATININE: 0.58 mg/dL (ref 0.40–1.20)
Calcium: 9.3 mg/dL (ref 8.4–10.5)
GFR: 118.81 mL/min (ref >60.00)
Glucose, Bld: 88 mg/dL (ref 70–99)
Potassium: 3.6 mEq/L (ref 3.5–5.1)
SODIUM: 136 meq/L (ref 135–145)
Total Bilirubin: 0.5 mg/dL (ref 0.2–1.2)
Total Protein: 7.5 g/dL (ref 6.0–8.3)

## 2016-12-28 LAB — CBC WITH DIFFERENTIAL/PLATELET
Basophils Absolute: 0 10*3/uL (ref 0.0–0.1)
Basophils Relative: 0.4 % (ref 0.0–3.0)
Eosinophils Absolute: 0.1 10*3/uL (ref 0.0–0.7)
Eosinophils Relative: 1.1 % (ref 0.0–5.0)
HCT: 41.3 % (ref 36.0–46.0)
Hemoglobin: 14.2 g/dL (ref 12.0–15.0)
Lymphocytes Relative: 37.1 % (ref 12.0–46.0)
Lymphs Abs: 2.5 10*3/uL (ref 0.7–4.0)
MCHC: 34.4 g/dL (ref 30.0–36.0)
MCV: 94.6 fl (ref 78.0–100.0)
Monocytes Absolute: 0.7 10*3/uL (ref 0.1–1.0)
Monocytes Relative: 9.9 % (ref 3.0–12.0)
Neutro Abs: 3.5 10*3/uL (ref 1.4–7.7)
Neutrophils Relative %: 51.5 % (ref 43.0–77.0)
Platelets: 252 10*3/uL (ref 150.0–400.0)
RBC: 4.36 Mil/uL (ref 3.87–5.11)
RDW: 13 % (ref 11.5–15.5)
WBC: 6.8 10*3/uL (ref 4.0–10.5)

## 2016-12-28 LAB — LIPID PANEL
Cholesterol: 222 mg/dL — ABNORMAL HIGH (ref 0–200)
HDL: 52.3 mg/dL (ref >39.00)
LDL Cholesterol: 144 mg/dL — ABNORMAL HIGH (ref 0–99)
NonHDL: 170.06
Total CHOL/HDL Ratio: 4
Triglycerides: 131 mg/dL (ref 0.0–149.0)
VLDL: 26.2 mg/dL (ref 0.0–40.0)

## 2016-12-28 LAB — VITAMIN D 25 HYDROXY (VIT D DEFICIENCY, FRACTURES): VITD: 22.9 ng/mL — ABNORMAL LOW (ref 30.00–100.00)

## 2016-12-28 LAB — TSH: TSH: 0.88 u[IU]/mL (ref 0.35–4.50)

## 2016-12-28 LAB — HEMOGLOBIN A1C: Hgb A1c MFr Bld: 5.6 % (ref 4.6–6.5)

## 2017-04-14 ENCOUNTER — Ambulatory Visit: Payer: Self-pay | Admitting: Obstetrics & Gynecology

## 2017-04-20 ENCOUNTER — Encounter: Payer: Self-pay | Admitting: Obstetrics & Gynecology

## 2017-04-20 ENCOUNTER — Ambulatory Visit (INDEPENDENT_AMBULATORY_CARE_PROVIDER_SITE_OTHER): Payer: 59 | Admitting: Obstetrics & Gynecology

## 2017-04-20 VITALS — BP 132/88 | Wt 181.6 lb

## 2017-04-20 DIAGNOSIS — R6882 Decreased libido: Secondary | ICD-10-CM

## 2017-04-20 NOTE — Progress Notes (Signed)
    Deanna ReekKristie L Schultz 1971/02/23 [id number]        46 y.o.  [id number] Husband has Vasectomy.  RP:  Decreased libido  HPI:  Married x 25 yrs.  Very good relationship.  Having satisfactory sex with mutual orgasm 2-3 times a week.  No dyspareunia. Husband felt that her libido had decreased because at the beginning of their marriage they were having sex everyday.  Patient feels good about her libido and sex life.  Husband usually initiates, but she responds positively to his advances.  Patient reports some stress at work and with raising the kids, but nothing major.  Menses reg normal flow every month.  No vaginal dryness.  No hot flushes or night sweats.  Annual/Gyn exam normal 11/2016.  Pap neg and Mammo neg 11/2016.   Past medical history,surgical history, problem list, medications, allergies, family history and social history were all reviewed and documented in the EPIC chart.  Directed ROS with pertinent positives and negatives documented in the history of present illness/assessment and plan.  Exam:  Vitals:   04/20/17 0932  BP: 132/88   General appearance:  Normal  Deferred  Assessment/Plan:  46 y.o. [id number]   1. Decreased libido without sexual dysfunction No evidence of sexual dysfunction.  Good marriage and sex life up to now in the couple.  No evidence of Menopause or other hormonal disorder.  Counseling done on importance of communication between her and her husband to reduce stressors and reassure each other on their continued interest and attraction.  Recommended initiation of sexual activity by her at times for better balance and organization of activities that favor intimacy.  Counseling >50% x 15 minutes on above issue.   Deanna DelMarie-Lyne Deanna Zuniga MD, 9:37 AM 04/20/2017

## 2017-04-20 NOTE — Patient Instructions (Signed)
1. Decreased libido without sexual dysfunction No evidence of sexual dysfunction.  Good marriage and sex life up to now in the couple.  No evidence of Menopause or other hormonal disorder.  Counseling done on importance of communication between her and her husband to reduce stressors and reassure each other on their continued interest and attraction.  Recommended initiation of sexual activity by her at times for better balance and organization of activities that favor intimacy.  [name], it was a pleasure to see you today!  Hope the visit helped you.  I will see you for your Annual/Gyn visit 11/2017.

## 2017-04-28 ENCOUNTER — Other Ambulatory Visit: Payer: Self-pay

## 2017-04-28 MED ORDER — LINACLOTIDE 145 MCG PO CAPS: 145.0000 ug | ORAL_CAPSULE | Freq: Every day | ORAL | 3 refills | Status: DC

## 2017-04-28 NOTE — Telephone Encounter (Signed)
Medication has been refilled.

## 2017-05-01 ENCOUNTER — Ambulatory Visit (INDEPENDENT_AMBULATORY_CARE_PROVIDER_SITE_OTHER): Payer: 59 | Admitting: Family

## 2017-05-01 ENCOUNTER — Encounter: Payer: Self-pay | Admitting: Family

## 2017-05-01 VITALS — BP 132/82 | HR 83 | Temp 98.7°F | Ht 63.0 in | Wt 182.6 lb

## 2017-05-01 DIAGNOSIS — R42 Dizziness and giddiness: Secondary | ICD-10-CM | POA: Insufficient documentation

## 2017-05-01 MED ORDER — MECLIZINE HCL 12.5 MG PO TABS: 12.5000 mg | ORAL_TABLET | Freq: Three times a day (TID) | ORAL | 0 refills | Status: DC | PRN

## 2017-05-01 NOTE — Patient Instructions (Signed)
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   You may look videos for Epley's maneuvers as we discussed online.   If dizziness/vertigo persists, a referral to ENT for further evaluation may be appropriate.   If there is no improvement in your symptoms, or if there is any worsening of symptoms, or if you have any additional concerns, please return to this clinic for re-evaluation; or, if we are closed, consider going to the Emergency Room for evaluation.   What is BPPV?  BPPV  is one of the most common causes of vertigo - the sudden sensation that you're spinning or that the inside of your head is spinning. Benign paroxysmal positional vertigo causes brief episodes of mild to intense dizziness. Benign paroxysmal positional vertigo is usually triggered by specific changes in the position of your head. This might occur when you tip your head up or down, when you lie down, or when you turn over or sit up in bed. Although benign paroxysmal positional vertigo can be a bothersome problem, it's rarely serious except when it increases the chance of falls.  If you experience dizziness associated with benign paroxysmal positional vertigo (BPPV), consider these tips: Be aware of the possibility of losing your balance, which can lead to falling and serious injury.  Sit down immediately when you feel dizzy.  Use good lighting if you get up at night.  Walk with a cane for stability if you're at risk of falling.  Work closely with your doctor to manage your symptoms effectively. BPPV may recur even after successful therapy. Fortunately, although there's no cure, the condition can be managed with physical therapy and home treatments.   Dizziness [Uncertain Cause] Dizziness is a common symptom sometimes described as "lightheadedness" or feeling like you are going to faint. If it lasts for only a few seconds and is related to changes  in position (such as getting up after lying or sitting for a long time), it is usually not a sign of anything serious. Dizziness that lasts for minutes to hours, or comes on for no apparent reason, may be a sign of a more serious problem (such as dehydration, a medicine reaction, disease of the heart or brain). Today's exam did not show an exact cause for your dizzy spell . Sometimes additional tests are required before a cause can be found. Therefore, it is important to follow up with your doctor if your symptoms continue. Home Care: 1) If a dizzy spell occurs and lasts more than a few seconds, lie down until it passes. If you are lying down, then you cannot hurt yourself by falling if you do faint. 2) Do not drive or operate dangerous equipment until the dizzy spells have stopped for at least 48 hours. 3) If dizzy spells occur with sudden standing, this may be a sign of mild dehydration. Drink extra fluids over the next few days. 4) If you recently started a new medicine or if you had the dose of a current medicine increased (especially blood pressure medicine), talk with the prescribing doctor about your symptoms. Dose adjustments may be needed. Follow Up with your doctor for further evaluation within the next seven days, if your symptoms continue. Get Prompt Medical Attention if any of the following occur: -- Worsening of your symptoms -- Fainting, headache or seizure -- Repeated vomiting -- Feeling like you or the room is spinning -- Chest, arm, neck, back or jaw pain --  Palpitations (the sense that your heart is fluttering or beating fast or hard) -- Shortness of breath -- Blood in vomit or stool (black or red color) -- Weakness of an arm or leg or one side of the face -- Difficulty with speech or vision  2000-2015 [employer] [address], [place], [place] [address]. All rights reserved. This information is not intended as a substitute for professional medical care.  Always follow your healthcare professional's instructions.

## 2017-05-01 NOTE — Progress Notes (Signed)
Subjective:    Patient ID: Deanna Schultz, female    DOB: [birth year], 46 y.o.   MRN: [id number]  CC: Deanna Schultz is a 46 y.o. female who presents today for an acute visit.    HPI: Episodes of  vertigo if 'moves too fast' Started 5 days ago when laying down and got up. Notices more when turns to right. Some nausea.   Feels like she 'is moving.' Sitting resolves episode.    No vision changes, hearing changes, tinnitus, HA, CP, SOB.   Endorses mild nasal congestion; no fever, sinus pain.     HISTORY:  Past Medical History:  Diagnosis Date  . Hyperlipemia   . Placenta previa    Past Surgical History:  Procedure Laterality Date  . CESAREAN SECTION     x 1   . DILATION AND CURETTAGE OF UTERUS  06/2002   due to miscarriage  . VAGINAL DELIVERY     x 2   Family History  Problem Relation Age of Onset  . Hyperlipidemia Mother   . Hypertension Mother   . Diabetes Mother   . Hypertension Father   . Heart disease Father   . Hyperlipidemia Father   . Arthritis Father   . Cancer Brother 25       Brain Tumor, GBM  . Hypertension Brother   . Heart disease Maternal Grandfather     Allergies: Patient has no known allergies. Current Outpatient Prescriptions on File Prior to Visit  Medication Sig Dispense Refill  . amLODipine (NORVASC) 5 MG tablet Take 1 tablet (5 mg total) by mouth daily. 90 tablet 3  . hydrochlorothiazide (MICROZIDE) 12.5 MG capsule TAKE ONE CAPSULE BY MOUTH ONE TIME DAILY. INS MAX 30 DAYS. 90 capsule 3  . linaclotide (LINZESS) 145 MCG CAPS capsule Take 1 capsule (145 mcg total) by mouth daily. 90 capsule 3  . Omega-3 Fatty Acids (FISH OIL PO) Take by mouth.    E[employer] [employer] Bicarbonate (ZEGERID) 20-1100 MG CAPS Take 1 capsule by mouth daily before breakfast. 28 each 3   No current facility-administered medications on file prior to visit.     Social History  Substance Use Topics  . Smoking status: Never Smoker  . Smokeless tobacco: Never Used   . Alcohol use 0.0 oz/week     Comment: 2-3 DRINKS A WEEK    Review of Systems  Constitutional: Negative for chills and fever.  HENT: Negative for congestion, ear discharge, ear pain, hearing loss and sinus pressure.   Eyes: Negative for visual disturbance.  Respiratory: Negative for cough and shortness of breath.   Cardiovascular: Negative for chest pain and palpitations.  Gastrointestinal: Negative for nausea and vomiting.  Genitourinary: Flank pain:    Neurological: Negative for dizziness, syncope, facial asymmetry and light-headedness.  Psychiatric/Behavioral: Negative for confusion.      Objective:    BP 132/82   Pulse 83   Temp 98.7 F (37.1 C) (Oral)   Ht 5\' 3"  (1.6 m)   Wt 182 lb 9.6 oz (82.8 kg)   LMP 04/11/2017   SpO2 95%   BMI 32.35 kg/m    Physical Exam  Constitutional: She appears well-developed and well-nourished.  HENT:  Head: Normocephalic and atraumatic.  Right Ear: Hearing, tympanic membrane, external ear and ear canal normal. No swelling or tenderness. Tympanic membrane is not erythematous and not bulging. No middle ear effusion.  Left Ear: Tympanic membrane, external ear and ear canal normal. No swelling or tenderness. Tympanic membrane is not  erythematous and not bulging.  No middle ear effusion.  Nose: Nose normal. No rhinorrhea. Right sinus exhibits no maxillary sinus tenderness and no frontal sinus tenderness. Left sinus exhibits no maxillary sinus tenderness and no frontal sinus tenderness.  Mouth/Throat: Uvula is midline, oropharynx is clear and moist and mucous membranes are normal. No posterior oropharyngeal edema or posterior oropharyngeal erythema.  Eyes: Conjunctivae, EOM and lids are normal. Pupils are equal, round, and reactive to light. Lids are everted and swept, no foreign bodies found.  Normal fundus bilaterally   Cardiovascular: Normal rate, regular rhythm, normal heart sounds and normal pulses.   Pulmonary/Chest: Effort normal and  breath sounds normal. She has no wheezes. She has no rhonchi. She has no rales.  Lymphadenopathy:       Head (right side): No submental, no submandibular, no tonsillar, no preauricular, no posterior auricular and no occipital adenopathy present.       Head (left side): No submental, no submandibular, no tonsillar, no preauricular, no posterior auricular and no occipital adenopathy present.    She has no cervical adenopathy.       Right cervical: No superficial cervical, no deep cervical and no posterior cervical adenopathy present.      Left cervical: No superficial cervical, no deep cervical and no posterior cervical adenopathy present.  Neurological: She is alert. She has normal strength. No cranial nerve deficit or sensory deficit. She displays a negative Romberg sign.  Reflex Scores:      Bicep reflexes are 2+ on the right side and 2+ on the left side.      Patellar reflexes are 2+ on the right side and 2+ on the left side. Grip equal and strong bilateral upper extremities. Gait strong and steady. Able to perform  finger-to-nose without difficulty.  Dix hall pike maneuver elicited vertigo. No nystagmus noted. Patient denied nausea or vertigo during maneuver.    Skin: Skin is warm and dry.  Psychiatric: She has a normal mood and affect. Her speech is normal and behavior is normal. Thought content normal.  Vitals reviewed.      Assessment & Plan:   Problem List Items Addressed This Visit      Other   Vertigo - Primary    Reassured by normal neurologic exam. + [employer].   Discussed at length with patient the causes of dizziness as well as my working diagnosis of BPPV.  Explained that this gesture, with recurrent episodes and interference with daily living, we will trial meclizine.  Advised patient to remain vigilant about triggers for dizziness as related to water intake, food and stress.         Relevant Medications   meclizine (ANTIVERT) 12.5 MG tablet        I am  having Deanna Schultz start on meclizine. I am also having her maintain her Omeprazole-Sodium Bicarbonate, Omega-3 Fatty Acids (FISH OIL PO), hydrochlorothiazide, amLODipine, and linaclotide.   Meds ordered this encounter  Medications  . meclizine (ANTIVERT) 12.5 MG tablet    Sig: Take 1 tablet (12.5 mg total) by mouth 3 (three) times daily as needed for dizziness.    Dispense:  30 tablet    Refill:  0    Order Specific Question:   Supervising Provider    Answer:   Crecencio Mc [[id number]]    Return precautions given.   Risks, benefits, and alternatives of the medications and treatment plan prescribed today were discussed, and patient expressed understanding.   Education regarding symptom management  and diagnosis given to patient on AVS.  Continue to follow with Dalma Panchal G, FNP Allegra Granafor routine health maintenance.   Deanna Schultz and I agreed with plan.   Deanna PlowmanMargaret Farhad Burleson, FNP

## 2017-05-01 NOTE — Assessment & Plan Note (Addendum)
Reassured by normal neurologic exam. + [employer]ix Hall pike.   Discussed at length with patient the causes of dizziness as well as my working diagnosis of BPPV.  Explained that this gesture, with recurrent episodes and interference with daily living, we will trial meclizine.  Advised patient to remain vigilant about triggers for dizziness as related to water intake, food and stress.

## 2017-05-01 NOTE — Progress Notes (Signed)
Pre visit review using our clinic review tool, if applicable. No additional management support is needed unless otherwise documented below in the visit note. 

## 2017-07-12 ENCOUNTER — Other Ambulatory Visit: Payer: Self-pay

## 2017-07-12 MED ORDER — HYDROCHLOROTHIAZIDE 12.5 MG PO CAPS: ORAL_CAPSULE | ORAL | 3 refills | Status: DC

## 2017-07-12 MED ORDER — AMLODIPINE BESYLATE 5 MG PO TABS: 5.0000 mg | ORAL_TABLET | Freq: Every day | ORAL | 3 refills | Status: DC

## 2017-07-12 NOTE — Telephone Encounter (Signed)
Medication has been refilled.

## 2018-02-06 ENCOUNTER — Encounter: Payer: Self-pay | Admitting: Obstetrics & Gynecology

## 2018-02-06 ENCOUNTER — Ambulatory Visit: Payer: 59 | Admitting: Obstetrics & Gynecology

## 2018-02-06 VITALS — BP 136/82 | Ht 62.5 in | Wt 185.0 lb

## 2018-02-06 DIAGNOSIS — Z01419 Encounter for gynecological examination (general) (routine) without abnormal findings: Secondary | ICD-10-CM | POA: Diagnosis not present

## 2018-02-06 DIAGNOSIS — Z9189 Other specified personal risk factors, not elsewhere classified: Secondary | ICD-10-CM

## 2018-02-06 NOTE — Patient Instructions (Signed)
1. Encounter for routine gynecological examination with Papanicolaou smear of cervix Normal gynecologic exam.  Pap reflex done.  Breast exam normal.  Last screening mammogram February 2019 at [facility] was normal per patient.  Will do health labs with family physician this spring.  Body mass index 33+, recommend low calorie/carb diet such as [employer] and increase aerobic physical activity to 5 times a week with weight lifting every 2 days.  2. Relies on partner vasectomy for contraception  Kerstie, it was a pleasure seeing you today!  I will inform you of your results as soon as they are available.  Health Maintenance, Female Adopting a healthy lifestyle and getting preventive care can go a long way to promote health and wellness. Talk with your health care provider about what schedule of regular examinations is right for you. This is a good chance for you to check in with your provider about disease prevention and staying healthy. In between checkups, there are plenty of things you can do on your own. Experts have done a lot of research about which lifestyle changes and preventive measures are most likely to keep you healthy. Ask your health care provider for more information. Weight and diet Eat a healthy diet  Be sure to include plenty of vegetables, fruits, low-fat dairy products, and lean protein.  Do not eat a lot of foods high in solid fats, added sugars, or salt.  Get regular exercise. This is one of the most important things you can do for your health. ? Most adults should exercise for at least 150 minutes each week. The exercise should increase your heart rate and make you sweat (moderate-intensity exercise). ? Most adults should also do strengthening exercises at least twice a week. This is in addition to the moderate-intensity exercise.  Maintain a healthy weight  Body mass index (BMI) is a measurement that can be used to identify possible weight problems. It  estimates body fat based on height and weight. Your health care provider can help determine your BMI and help you achieve or maintain a healthy weight.  For females 75 years of age and older: ? A BMI below 18.5 is considered underweight. ? A BMI of 18.5 to 24.9 is normal. ? A BMI of 25 to 29.9 is considered overweight. ? A BMI of 30 and above is considered obese.  Watch levels of cholesterol and blood lipids  You should start having your blood tested for lipids and cholesterol at 48 years of age, then have this test every 5 years.  You may need to have your cholesterol levels checked more often if: ? Your lipid or cholesterol levels are high. ? You are older than 47 years of age. ? You are at high risk for heart disease.  Cancer screening Lung Cancer  Lung cancer screening is recommended for adults 74-70 years old who are at high risk for lung cancer because of a history of smoking.  A yearly low-dose CT scan of the lungs is recommended for people who: ? Currently smoke. ? Have quit within the past 15 years. ? Have at least a 30-pack-year history of smoking. A pack year is smoking an average of one pack of cigarettes a day for 1 year.  Yearly screening should continue until it has been 15 years since you quit.  Yearly screening should stop if you develop a health problem that would prevent you from having lung cancer treatment.  Breast Cancer  Practice breast self-awareness. This means understanding  how your breasts normally appear and feel.  It also means doing regular breast self-exams. Let your health care provider know about any changes, no matter how small.  If you are in your 20s or 30s, you should have a clinical breast exam (CBE) by a health care provider every 1-3 years as part of a regular health exam.  If you are 24 or older, have a CBE every year. Also consider having a breast X-ray (mammogram) every year.  If you have a family history of breast cancer, talk to  your health care provider about genetic screening.  If you are at high risk for breast cancer, talk to your health care provider about having an MRI and a mammogram every year.  Breast cancer gene (BRCA) assessment is recommended for women who have family members with BRCA-related cancers. BRCA-related cancers include: ? Breast. ? Ovarian. ? Tubal. ? Peritoneal cancers.  Results of the assessment will determine the need for genetic counseling and BRCA1 and BRCA2 testing.  Cervical Cancer Your health care provider may recommend that you be screened regularly for cancer of the pelvic organs (ovaries, uterus, and vagina). This screening involves a pelvic examination, including checking for microscopic changes to the surface of your cervix (Pap test). You may be encouraged to have this screening done every 3 years, beginning at age 70.  For women ages 63-65, health care providers may recommend pelvic exams and Pap testing every 3 years, or they may recommend the Pap and pelvic exam, combined with testing for human papilloma virus (HPV), every 5 years. Some types of HPV increase your risk of cervical cancer. Testing for HPV may also be done on women of any age with unclear Pap test results.  Other health care providers may not recommend any screening for nonpregnant women who are considered low risk for pelvic cancer and who do not have symptoms. Ask your health care provider if a screening pelvic exam is right for you.  If you have had past treatment for cervical cancer or a condition that could lead to cancer, you need Pap tests and screening for cancer for at least 20 years after your treatment. If Pap tests have been discontinued, your risk factors (such as having a new sexual partner) need to be reassessed to determine if screening should resume. Some women have medical problems that increase the chance of getting cervical cancer. In these cases, your health care provider may recommend more  frequent screening and Pap tests.  Colorectal Cancer  This type of cancer can be detected and often prevented.  Routine colorectal cancer screening usually begins at 48 years of age and continues through 48 years of age.  Your health care provider may recommend screening at an earlier age if you have risk factors for colon cancer.  Your health care provider may also recommend using home test kits to check for hidden blood in the stool.  A small camera at the end of a tube can be used to examine your colon directly (sigmoidoscopy or colonoscopy). This is done to check for the earliest forms of colorectal cancer.  Routine screening usually begins at age 52.  Direct examination of the colon should be repeated every 5-10 years through 48 years of age. However, you may need to be screened more often if early forms of precancerous polyps or small growths are found.  Skin Cancer  Check your skin from head to toe regularly.  Tell your health care provider about any new moles or  changes in moles, especially if there is a change in a mole's shape or color.  Also tell your health care provider if you have a mole that is larger than the size of a pencil eraser.  Always use sunscreen. Apply sunscreen liberally and repeatedly throughout the day.  Protect yourself by wearing long sleeves, pants, a wide-brimmed hat, and sunglasses whenever you are outside.  Heart disease, diabetes, and high blood pressure  High blood pressure causes heart disease and increases the risk of stroke. High blood pressure is more likely to develop in: ? People who have blood pressure in the high end of the normal range (130-139/85-89 mm Hg). ? People who are overweight or obese. ? People who are African American.  If you are 2-39 years of age, have your blood pressure checked every 3-5 years. If you are 39 years of age or older, have your blood pressure checked every year. You should have your blood pressure measured  twice-once when you are at a hospital or clinic, and once when you are not at a hospital or clinic. Record the average of the two measurements. To check your blood pressure when you are not at a hospital or clinic, you can use: ? An automated blood pressure machine at a pharmacy. ? A home blood pressure monitor.  If you are between 41 years and 20 years old, ask your health care provider if you should take aspirin to prevent strokes.  Have regular diabetes screenings. This involves taking a blood sample to check your fasting blood sugar level. ? If you are at a normal weight and have a low risk for diabetes, have this test once every three years after 48 years of age. ? If you are overweight and have a high risk for diabetes, consider being tested at a younger age or more often. Preventing infection Hepatitis B  If you have a higher risk for hepatitis B, you should be screened for this virus. You are considered at high risk for hepatitis B if: ? You were born in a country where hepatitis B is common. Ask your health care provider which countries are considered high risk. ? Your parents were born in a high-risk country, and you have not been immunized against hepatitis B (hepatitis B vaccine). ? You have HIV or AIDS. ? You use needles to inject street drugs. ? You live with someone who has hepatitis B. ? You have had sex with someone who has hepatitis B. ? You get hemodialysis treatment. ? You take certain medicines for conditions, including cancer, organ transplantation, and autoimmune conditions.  Hepatitis C  Blood testing is recommended for: ? Everyone born from [birth year] through 1966. ? Anyone with known risk factors for hepatitis C.  Sexually transmitted infections (STIs)  You should be screened for sexually transmitted infections (STIs) including gonorrhea and chlamydia if: ? You are sexually active and are younger than 48 years of age. ? You are older than 48 years of age and your  health care provider tells you that you are at risk for this type of infection. ? Your sexual activity has changed since you were last screened and you are at an increased risk for chlamydia or gonorrhea. Ask your health care provider if you are at risk.  If you do not have HIV, but are at risk, it may be recommended that you take a prescription medicine daily to prevent HIV infection. This is called pre-exposure prophylaxis (PrEP). You are considered at risk if: ?  You are sexually active and do not regularly use condoms or know the HIV status of your partner(s). ? You take drugs by injection. ? You are sexually active with a partner who has HIV.  Talk with your health care provider about whether you are at high risk of being infected with HIV. If you choose to begin PrEP, you should first be tested for HIV. You should then be tested every 3 months for as long as you are taking PrEP. Pregnancy  If you are premenopausal and you may become pregnant, ask your health care provider about preconception counseling.  If you may become pregnant, take 400 to 800 micrograms (mcg) of folic acid every day.  If you want to prevent pregnancy, talk to your health care provider about birth control (contraception). Osteoporosis and menopause  Osteoporosis is a disease in which the bones lose minerals and strength with aging. This can result in serious bone fractures. Your risk for osteoporosis can be identified using a bone density scan.  If you are 30 years of age or older, or if you are at risk for osteoporosis and fractures, ask your health care provider if you should be screened.  Ask your health care provider whether you should take a calcium or vitamin D supplement to lower your risk for osteoporosis.  Menopause may have certain physical symptoms and risks.  Hormone replacement therapy may reduce some of these symptoms and risks. Talk to your health care provider about whether hormone replacement  therapy is right for you. Follow these instructions at home:  Schedule regular health, dental, and eye exams.  Stay current with your immunizations.  Do not use any tobacco products including cigarettes, chewing tobacco, or electronic cigarettes.  If you are pregnant, do not drink alcohol.  If you are breastfeeding, limit how much and how often you drink alcohol.  Limit alcohol intake to no more than 1 drink per day for nonpregnant women. One drink equals 12 ounces of beer, 5 ounces of wine, or 1 ounces of hard liquor.  Do not use street drugs.  Do not share needles.  Ask your health care provider for help if you need support or information about quitting drugs.  Tell your health care provider if you often feel depressed.  Tell your health care provider if you have ever been abused or do not feel safe at home. This information is not intended to replace advice given to you by your health care provider. Make sure you discuss any questions you have with your health care provider. Document Released: 05/30/2011 Document Revised: 04/21/2016 Document Reviewed: 08/18/2015 Elsevier Interactive Patient Education  [employer].

## 2018-02-06 NOTE — Progress Notes (Signed)
Deanna Schultz 01/19/1971 [id number]   History:    47 y.o. [id number] Married.  Vasectomy.  Son is 17+, daughters 3114 and 48 yo.  RP:  Established patient presenting for annual gyn exam   HPI: Menses regular normal every month.  No pelvic pain.  Normal vaginal secretions.  No pain with intercourse.  Urine and bowel movements normal.  Breasts normal.  Body mass index 33.3.  Health labs with family physician.  Past medical history,surgical history, family history and social history were all reviewed and documented in the EPIC chart.  Gynecologic History Patient's last menstrual period was 01/24/2018. Contraception: vasectomy Last Pap: 2018. Results were: normal Last mammogram: 12/2017. Results were: normal Bone Density: Never Colonoscopy: Never  Obstetric History OB History  Gravida Para Term Preterm AB Living  4 3     1 3   SAB TAB Ectopic Multiple Live Births  1            # Outcome Date GA Lbr Len/2nd Weight Sex Delivery Anes PTL Lv  4 SAB           3 Para           2 Para           1 Para                ROS: A ROS was performed and pertinent positives and negatives are included in the history.  GENERAL: No fevers or chills. HEENT: No change in vision, no earache, sore throat or sinus congestion. NECK: No pain or stiffness. CARDIOVASCULAR: No chest pain or pressure. No palpitations. PULMONARY: No shortness of breath, cough or wheeze. GASTROINTESTINAL: No abdominal pain, nausea, vomiting or diarrhea, melena or bright red blood per rectum. GENITOURINARY: No urinary frequency, urgency, hesitancy or dysuria. MUSCULOSKELETAL: No joint or muscle pain, no back pain, no recent trauma. DERMATOLOGIC: No rash, no itching, no lesions. ENDOCRINE: No polyuria, polydipsia, no heat or cold intolerance. No recent change in weight. HEMATOLOGICAL: No anemia or easy bruising or bleeding. NEUROLOGIC: No headache, seizures, numbness, tingling or weakness. PSYCHIATRIC: No depression, no loss of  interest in normal activity or change in sleep pattern.     Exam:   BP 136/82   Ht 5' 2.5" (1.588 m)   Wt 185 lb (83.9 kg)   LMP 01/24/2018   BMI 33.30 kg/m   Body mass index is 33.3 kg/m.  General appearance : Well developed well nourished female. No acute distress HEENT: Eyes: no retinal hemorrhage or exudates,  Neck supple, trachea midline, no carotid bruits, no thyroidmegaly Lungs: Clear to auscultation, no rhonchi or wheezes, or rib retractions  Heart: Regular rate and rhythm, no murmurs or gallops Breast:Examined in sitting and supine position were symmetrical in appearance, no palpable masses or tenderness,  no skin retraction, no nipple inversion, no nipple discharge, no skin discoloration, no axillary or supraclavicular lymphadenopathy Abdomen: no palpable masses or tenderness, no rebound or guarding Extremities: no edema or skin discoloration or tenderness  Pelvic: Vulva: Normal             Vagina: No gross lesions or discharge  Cervix: No gross lesions or discharge.  Pap reflex done.  Uterus  AV, normal size, shape and consistency, non-tender and mobile  Adnexa  Without masses or tenderness  Anus: Normal   Assessment/Plan:  47 y.o. female for annual exam   1. Encounter for routine gynecological examination with Papanicolaou smear of cervix Normal gynecologic exam.  Pap reflex  done.  Breast exam normal.  Last screening mammogram February 2019 at [facility] was normal per patient.  Will do health labs with family physician this spring.  Body mass index 33+, recommend low calorie/carb diet such as [employer] and increase aerobic physical activity to 5 times a week with weight lifting every 2 days.  2. Relies on partner vasectomy for contraception   Genia Del MD, 10:04 AM 02/06/2018

## 2018-02-06 NOTE — Addendum Note (Signed)
Addended by: Berna SpareASTILLO, Kebin Maye A on: 02/06/2018 10:46 AM   Modules accepted: Orders

## 2018-02-08 LAB — PAP IG W/ RFLX HPV ASCU

## 2018-02-23 ENCOUNTER — Encounter: Payer: Self-pay | Admitting: Family

## 2018-02-23 ENCOUNTER — Ambulatory Visit (INDEPENDENT_AMBULATORY_CARE_PROVIDER_SITE_OTHER): Payer: 59 | Admitting: Family

## 2018-02-23 VITALS — BP 122/70 | HR 71 | Temp 98.5°F | Resp 15 | Ht 63.0 in | Wt 184.2 lb

## 2018-02-23 DIAGNOSIS — K581 Irritable bowel syndrome with constipation: Secondary | ICD-10-CM | POA: Diagnosis not present

## 2018-02-23 DIAGNOSIS — Z0000 Encounter for general adult medical examination without abnormal findings: Secondary | ICD-10-CM | POA: Diagnosis not present

## 2018-02-23 DIAGNOSIS — I10 Essential (primary) hypertension: Secondary | ICD-10-CM | POA: Diagnosis not present

## 2018-02-23 MED ORDER — LINACLOTIDE 145 MCG PO CAPS: 145.0000 ug | ORAL_CAPSULE | Freq: Every day | ORAL | 3 refills | Status: DC

## 2018-02-23 NOTE — Assessment & Plan Note (Signed)
At goal. Continue current regimen. 

## 2018-02-23 NOTE — Assessment & Plan Note (Signed)
Clinical breast exam performed.  Declines pelvic exam in the absence of complaints, and Pap up-to-date.  Per patient, mammogram done in February of this year.  Screening labs ordered.

## 2018-02-23 NOTE — Progress Notes (Signed)
Subjective:    Patient ID: Deanna Schultz, female    DOB: 1971/04/23, 47 y.o.   MRN: [id number]  CC: Deanna Schultz is a 47 y.o. female who presents today for physical exam.    HPI: Doing well today, no complaints. Hypertension-compliant with medications.  Denies any chest pain, SOB, palpitations Irritable bowel syndrome-more the constipation variety.  Linzess helps with constipation.  She was requesting a refill today.    Colorectal Cancer Screening: No early family history of colon cancer.  No changes in bowel habits. Breast Cancer Screening: Mammogram UTD per patient  12/2017; unable to see report Cervical Cancer Screening: UTD, pap 01/2018 normal.  Bone Health screening/DEXA for 65+: No increased fracture risk. Defer screening at this time. Lung Cancer Screening: Doesn't have 30 year pack year history and age > 52 years.       Tetanus - utd        Labs: Screening labs today. Exercise: Gets regular exercise.  Alcohol use: moderate Smoking/tobacco use: Nonsmoker.  Regular dental exams:UTD Wears seat belt: Yes. Skin: No new lesions or changes in lesions.  No personal history of skin cancer or family history of melanoma.  HISTORY:  Past Medical History:  Diagnosis Date  . Hyperlipemia   . Placenta previa     Past Surgical History:  Procedure Laterality Date  . CESAREAN SECTION     x 1   . DILATION AND CURETTAGE OF UTERUS  06/2002   due to miscarriage  . VAGINAL DELIVERY     x 2   Family History  Problem Relation Age of Onset  . Hyperlipidemia Mother   . Hypertension Mother   . Diabetes Mother   . Hypertension Father   . Heart disease Father   . Hyperlipidemia Father   . Arthritis Father   . Cancer Brother 25       Brain Tumor, GBM  . Hypertension Brother   . Heart disease Maternal Grandfather       ALLERGIES: Patient has no known allergies.  Current Outpatient Medications on File Prior to Visit  Medication Sig Dispense Refill  . amLODipine  (NORVASC) 5 MG tablet Take 1 tablet (5 mg total) by mouth daily. 90 tablet 3  . hydrochlorothiazide (MICROZIDE) 12.5 MG capsule TAKE ONE CAPSULE BY MOUTH ONE TIME DAILY. INS MAX 30 DAYS. 90 capsule 3  . meclizine (ANTIVERT) 12.5 MG tablet Take 1 tablet (12.5 mg total) by mouth 3 (three) times daily as needed for dizziness. 30 tablet 0  . Omega-3 Fatty Acids (FISH OIL PO) Take by mouth.    Earney Navy Bicarbonate (ZEGERID) 20-1100 MG CAPS Take 1 capsule by mouth daily before breakfast. 28 each 3   No current facility-administered medications on file prior to visit.     Social History   Tobacco Use  . Smoking status: Never Smoker  . Smokeless tobacco: Never Used  Substance Use Topics  . Alcohol use: Yes    Alcohol/week: 0.0 oz    Comment: 2-3 DRINKS A WEEK  . Drug use: No    Review of Systems  Constitutional: Negative for chills, fever and unexpected weight change.  HENT: Negative for congestion.   Respiratory: Negative for cough.   Cardiovascular: Negative for chest pain, palpitations and leg swelling.  Gastrointestinal: Positive for constipation (chronic). Negative for nausea and vomiting.  Genitourinary: Negative for pelvic pain.  Musculoskeletal: Negative for arthralgias and myalgias.  Skin: Negative for rash.  Neurological: Negative for headaches.  Hematological: Negative for  adenopathy.  Psychiatric/Behavioral: Negative for confusion.      Objective:    BP 122/70 (BP Location: Left Arm, Patient Position: Sitting, Cuff Size: Normal)   Pulse 71   Temp 98.5 F (36.9 C) (Oral)   Resp 15   Ht 5\' 3"  (1.6 m)   Wt 184 lb 4 oz (83.6 kg)   LMP 02/21/2018   SpO2 98%   BMI 32.64 kg/m   BP Readings from Last 3 Encounters:  02/23/18 122/70  02/06/18 136/82  05/01/17 132/82   Wt Readings from Last 3 Encounters:  02/23/18 184 lb 4 oz (83.6 kg)  02/06/18 185 lb (83.9 kg)  05/01/17 182 lb 9.6 oz (82.8 kg)    Physical Exam  Constitutional: She appears  well-developed and well-nourished.  Eyes: Conjunctivae are normal.  Neck: No thyroid mass and no thyromegaly present.  Cardiovascular: Normal rate, regular rhythm, normal heart sounds and normal pulses.  Pulmonary/Chest: Effort normal and breath sounds normal. She has no wheezes. She has no rhonchi. She has no rales. Right breast exhibits no inverted nipple, no mass, no nipple discharge, no skin change and no tenderness. Left breast exhibits no inverted nipple, no mass, no nipple discharge, no skin change and no tenderness. Breasts are symmetrical.  CBE performed.   Lymphadenopathy:       Head (right side): No submental, no submandibular, no tonsillar, no preauricular, no posterior auricular and no occipital adenopathy present.       Head (left side): No submental, no submandibular, no tonsillar, no preauricular, no posterior auricular and no occipital adenopathy present.    She has no cervical adenopathy.       Right cervical: No superficial cervical, no deep cervical and no posterior cervical adenopathy present.      Left cervical: No superficial cervical, no deep cervical and no posterior cervical adenopathy present.    She has no axillary adenopathy.  Neurological: She is alert.  Skin: Skin is warm and dry.  Psychiatric: She has a normal mood and affect. Her speech is normal and behavior is normal. Thought content normal.  Vitals reviewed.      Assessment & Plan:   Problem List Items Addressed This Visit      Cardiovascular and Mediastinum   Essential hypertension, benign (Chronic)    At goal.  Continue current regimen        Digestive   Irritable bowel syndrome with constipation (Chronic)    Stable.  Refilled Linzess      Relevant Medications   linaclotide (LINZESS) 145 MCG CAPS capsule     Other   Routine general medical examination at a health care facility - Primary    Clinical breast exam performed.  Declines pelvic exam in the absence of complaints, and Pap  up-to-date.  Per patient, mammogram done in February of this year.  Screening labs ordered.      Relevant Orders   CBC with Differential/Platelet   Comprehensive metabolic panel   Hemoglobin A1c   Lipid panel   VITAMIN D 25 Hydroxy (Vit-D Deficiency, Fractures)       I am having River L. Welle maintain her Omeprazole-Sodium Bicarbonate, Omega-3 Fatty Acids (FISH OIL PO), meclizine, hydrochlorothiazide, amLODipine, and linaclotide.   Meds ordered this encounter  Medications  . linaclotide (LINZESS) 145 MCG CAPS capsule    Sig: Take 1 capsule (145 mcg total) by mouth daily.    Dispense:  90 capsule    Refill:  3    Return precautions given.  Risks, benefits, and alternatives of the medications and treatment plan prescribed today were discussed, and patient expressed understanding.   Education regarding symptom management and diagnosis given to patient on AVS.   Continue to follow with Allegra GranaArnett, Jesstin Studstill G, FNP for routine health maintenance.   Lesle ReekKristie L Frary and I agreed with plan.   Rennie PlowmanMargaret Ithan Touhey, FNP

## 2018-02-23 NOTE — Assessment & Plan Note (Signed)
Stable.  Refilled Linzess

## 2018-02-26 ENCOUNTER — Encounter: Payer: Self-pay | Admitting: Family

## 2018-03-01 ENCOUNTER — Telehealth: Payer: Self-pay | Admitting: Family

## 2018-03-01 NOTE — Telephone Encounter (Signed)
Copied from CRM [id number]. Topic: Quick Communication - See Telephone Encounter >> Mar 01, 2018 10:08 AM Oneal GroutSebastian, Jennifer S wrote: CRM for notification. See Telephone encounter for: 03/01/18. Patient checking status of PA of linaclotide (LINZESS) 145 MCG CAPS capsule

## 2018-03-05 ENCOUNTER — Other Ambulatory Visit (INDEPENDENT_AMBULATORY_CARE_PROVIDER_SITE_OTHER): Payer: 59

## 2018-03-05 DIAGNOSIS — Z0000 Encounter for general adult medical examination without abnormal findings: Secondary | ICD-10-CM | POA: Diagnosis not present

## 2018-03-05 LAB — LIPID PANEL
CHOL/HDL RATIO: 4
Cholesterol: 198 mg/dL (ref 0–200)
HDL: 54 mg/dL (ref >39.00)
LDL CALC: 122 mg/dL — AB (ref 0–99)
NonHDL: 144.11
Triglycerides: 110 mg/dL (ref 0.0–149.0)
VLDL: 22 mg/dL (ref 0.0–40.0)

## 2018-03-05 LAB — CBC WITH DIFFERENTIAL/PLATELET
BASOS PCT: 0.3 % (ref 0.0–3.0)
Basophils Absolute: 0 10*3/uL (ref 0.0–0.1)
EOS ABS: 0.1 10*3/uL (ref 0.0–0.7)
EOS PCT: 0.9 % (ref 0.0–5.0)
HEMATOCRIT: 39 % (ref 36.0–46.0)
HEMOGLOBIN: 13.5 g/dL (ref 12.0–15.0)
LYMPHS PCT: 31.3 % (ref 12.0–46.0)
Lymphs Abs: 2.3 10*3/uL (ref 0.7–4.0)
MCHC: 34.6 g/dL (ref 30.0–36.0)
MCV: 93.8 fl (ref 78.0–100.0)
Monocytes Absolute: 0.7 10*3/uL (ref 0.1–1.0)
Monocytes Relative: 9.3 % (ref 3.0–12.0)
Neutro Abs: 4.2 10*3/uL (ref 1.4–7.7)
Neutrophils Relative %: 58.2 % (ref 43.0–77.0)
Platelets: 232 10*3/uL (ref 150.0–400.0)
RBC: 4.15 Mil/uL (ref 3.87–5.11)
RDW: 13.1 % (ref 11.5–15.5)
WBC: 7.2 10*3/uL (ref 4.0–10.5)

## 2018-03-05 LAB — COMPREHENSIVE METABOLIC PANEL
ALBUMIN: 4.2 g/dL (ref 3.5–5.2)
ALT: 23 U/L (ref 0–35)
AST: 18 U/L (ref 0–37)
Alkaline Phosphatase: 37 U/L — ABNORMAL LOW (ref 39–117)
BUN: 13 mg/dL (ref 6–23)
CALCIUM: 9.3 mg/dL (ref 8.4–10.5)
CHLORIDE: 101 meq/L (ref 96–112)
CO2: 27 mEq/L (ref 19–32)
Creatinine, Ser: 0.63 mg/dL (ref 0.40–1.20)
GFR: 107.45 mL/min (ref >60.00)
Glucose, Bld: 86 mg/dL (ref 70–99)
POTASSIUM: 3.5 meq/L (ref 3.5–5.1)
Sodium: 138 mEq/L (ref 135–145)
Total Bilirubin: 0.4 mg/dL (ref 0.2–1.2)
Total Protein: 7.7 g/dL (ref 6.0–8.3)

## 2018-03-05 LAB — VITAMIN D 25 HYDROXY (VIT D DEFICIENCY, FRACTURES): VITD: 25.61 ng/mL — ABNORMAL LOW (ref 30.00–100.00)

## 2018-03-05 LAB — HEMOGLOBIN A1C: Hgb A1c MFr Bld: 5.6 % (ref 4.6–6.5)

## 2018-03-07 ENCOUNTER — Encounter: Payer: Self-pay | Admitting: Family

## 2018-03-12 NOTE — Telephone Encounter (Signed)
[id number]

## 2018-03-12 NOTE — Telephone Encounter (Signed)
Linzess approved ?

## 2018-03-12 NOTE — Telephone Encounter (Addendum)
Prior authorization submitted , Linzess approved until 03/13/2019 , pharmacy advised approved

## 2018-03-13 NOTE — Telephone Encounter (Signed)
Mychart message sent.

## 2018-06-15 ENCOUNTER — Other Ambulatory Visit: Payer: Self-pay | Admitting: Family

## 2018-07-17 ENCOUNTER — Other Ambulatory Visit: Payer: Self-pay | Admitting: Family

## 2018-11-05 ENCOUNTER — Other Ambulatory Visit: Payer: Self-pay | Admitting: Family

## 2018-11-07 NOTE — Telephone Encounter (Signed)
Call patient I refilled her Linzess today.  Please emphasize to the patient if your irritable bowel symptoms have changed anyway, I would emphasize the need for GI consult.  I do not see that she is had a colonoscopy.  Also happy to place a referral for her to have an early colonoscopy based on history of year-old bowel syndrome. Please let me know patient thoughts.

## 2018-11-08 ENCOUNTER — Telehealth: Payer: Self-pay

## 2018-11-08 DIAGNOSIS — Z1211 Encounter for screening for malignant neoplasm of colon: Secondary | ICD-10-CM

## 2018-11-08 NOTE — Telephone Encounter (Signed)
Copied from CRM [phone]. Topic: General - Other >> Nov 08, 2018 11:24 AM Marylen PontoMcneil, Ja-Kwan wrote: Reason for CRM: Patient returned call to office. Informed patient of message and she stated she would like to have a referral for an early colonoscopy. Patient requests call back regarding referral for colonoscopy.

## 2018-11-08 NOTE — Telephone Encounter (Signed)
LMTCB to advise on advice below.

## 2018-11-12 NOTE — Telephone Encounter (Signed)
Patient notified & stated she would call back if she doesn't here about referral.

## 2018-11-12 NOTE — Telephone Encounter (Signed)
Call patient I have referred her to gastroenterology to establish care for irritable  bowel syndrome and also to discuss early colonoscopy.  Please call our office if we have not called her in the next 7 to 10 days with an appointment.

## 2018-11-12 NOTE — Addendum Note (Signed)
Addended by: Allegra GranaARNETT, Tatanisha Cuthbert G on: 11/12/2018 01:04 PM   Modules accepted: Orders

## 2018-12-05 ENCOUNTER — Encounter: Payer: Self-pay | Admitting: Gastroenterology

## 2018-12-14 ENCOUNTER — Encounter: Payer: Self-pay | Admitting: Gastroenterology

## 2018-12-14 ENCOUNTER — Ambulatory Visit: Payer: 59 | Admitting: Gastroenterology

## 2018-12-14 VITALS — BP 160/88 | HR 68 | Ht 63.0 in | Wt 184.2 lb

## 2018-12-14 DIAGNOSIS — K581 Irritable bowel syndrome with constipation: Secondary | ICD-10-CM

## 2018-12-14 DIAGNOSIS — Z8371 Family history of colonic polyps: Secondary | ICD-10-CM | POA: Diagnosis not present

## 2018-12-14 DIAGNOSIS — Z1211 Encounter for screening for malignant neoplasm of colon: Secondary | ICD-10-CM | POA: Diagnosis not present

## 2018-12-14 MED ORDER — NA SULFATE-K SULFATE-MG SULF 17.5-3.13-1.6 GM/177ML PO SOLN: 1.0000 | ORAL | 0 refills | Status: AC

## 2018-12-14 NOTE — Patient Instructions (Signed)
You have been scheduled for a colonoscopy. Please follow written instructions given to you at your visit today.  Please pick up your prep supplies at the pharmacy within the next 1-3 days. If you use inhalers (even only as needed), please bring them with you on the day of your procedure. Your physician has requested that you go to www.startemmi.com and enter the access code given to you at your visit today. This web site gives a general overview about your procedure. However, you should still follow specific instructions given to you by our office regarding your preparation for the procedure.  We have sent the following medications to your pharmacy for you to pick up at your convenience: linzess 145 mcg daily   Continue Metamucil

## 2018-12-14 NOTE — Progress Notes (Signed)
Referring Provider: Burnard Hawthorne, FNP Primary Care Physician:  Burnard Hawthorne, FNP   Reason for Consultation:  Bloating, family history of colon polyps   IMPRESSION:  Family history of colon polyps Constipation-predominant IBS No prior colon cancer screening  Colonoscopy recommended for colon cancer screening.   Continue Linzess with psyllium supplements given the adequate control of her symptoms.   PLAN: Obtain records from Dr. Collene Mares regarding diagnosis of IBS Colonoscopy Linzess 145 mcg daily Continue Metamucil   I consented the patient at the bedside today discussing the risks, benefits, and alternatives to endoscopic evaluation. In particular, we discussed the risks that include, but are not limited to, reaction to medication, cardiopulmonary compromise, bleeding requiring blood transfusion, aspiration resulting in pneumonia, perforation requiring surgery, lack of diagnosis, severe illness requiring hospitalization, and even death. We reviewed the risk of missed lesion including polyps or even cancer. The patient acknowledges these risks and asks that we proceed.   HPI: Deanna Schultz is a 48 y.o. female seen in consultation at the request of NP Arnett for further evaluation of IBS. The history is obtained through the patient and review of her electronic health record.   Prior evaluation by Dr. Collene Mares in 2009. Ongoing problems since then. Bloating, gas, and flatus. Associated constipation.  Worse in the evening.   Dr. Sandi Carne at [facility] in [place] completed her evaluation. Excluded lactose intolerance and celiac disease. Started her on Linzess 145 mcg daily x years with control of constipation and bloating. BM daily on Linzess. Without Linzess, her symptoms will recur. With some gassy foods she will have an exacerbation in her symptoms.  No blood or mucous. No alarm features. She is satisfied with Linzess. She has recently added Metamucil to improve her colon  health. No other identified aggrevating or relieving features.   Mother and father with colon polyps. Details are unclear as they live in [place] and do no disclose health concerns. The patient has never had any endoscopy or colon cancer screening.    Past Medical History:  Diagnosis Date  . Hyperlipemia   . IBS (irritable bowel syndrome)   . Placenta previa     Past Surgical History:  Procedure Laterality Date  . CESAREAN SECTION     x 1   . DILATION AND CURETTAGE OF UTERUS  06/2002   due to miscarriage  . VAGINAL DELIVERY     x 2    Current Outpatient Medications  Medication Sig Dispense Refill  . amLODipine (NORVASC) 5 MG tablet TAKE 1 TABLET BY MOUTH EVERY DAY 30 tablet 6  . hydrochlorothiazide (MICROZIDE) 12.5 MG capsule TAKE ONE CAPSULE BY MOUTH ONE TIME DAILY 30 capsule 6  . LINZESS 145 MCG CAPS capsule TAKE 1 CAPSULE BY MOUTH  DAILY 90 capsule 1  . meclizine (ANTIVERT) 12.5 MG tablet Take 1 tablet (12.5 mg total) by mouth 3 (three) times daily as needed for dizziness. 30 tablet 0  . Omeprazole-Sodium Bicarbonate (ZEGERID) 20-1100 MG CAPS Take 1 capsule by mouth daily before breakfast. 28 each 3   No current facility-administered medications for this visit.     Allergies as of 12/14/2018  . (No Known Allergies)    Family History  Problem Relation Age of Onset  . Hyperlipidemia Mother   . Hypertension Mother   . Diabetes Mother   . Colon polyps Mother   . Heart disease Mother   . Hypertension Father   . Heart disease Father   . Hyperlipidemia Father   .  Arthritis Father   . Colon polyps Father   . Cancer Brother 25       Brain Tumor, GBM  . Hypertension Brother   . Heart disease Maternal Grandfather     Social History   Socioeconomic History  . Marital status: Married    Spouse name: Not on file  . Number of children: 3  . Years of education: Not on file  . Highest education level: Not on file  Occupational History  . Occupation: Stay at [employer]  . Financial resource strain: Not on file  . Food insecurity:    Worry: Not on file    Inability: Not on file  . Transportation needs:    Medical: Not on file    Non-medical: Not on file  Tobacco Use  . Smoking status: Never Smoker  . Smokeless tobacco: Never Used  Substance and Sexual Activity  . Alcohol use: Yes    Alcohol/week: 0.0 standard drinks    Comment: 2-3 DRINKS A WEEK  . Drug use: No  . Sexual activity: Yes    Partners: Male    Birth control/protection: None    Comment: 1st intercourse- 70, partners- 36, married- 60 yrs   Lifestyle  . Physical activity:    Days per week: Not on file    Minutes per session: Not on file  . Stress: Not on file  Relationships  . Social connections:    Talks on phone: Not on file    Gets together: Not on file    Attends religious service: Not on file    Active member of club or organization: Not on file    Attends meetings of clubs or organizations: Not on file    Relationship status: Not on file  . Intimate partner violence:    Fear of current or ex partner: Not on file    Emotionally abused: Not on file    Physically abused: Not on file    Forced sexual activity: Not on file  Other Topics Concern  . Not on file  Social History Narrative   Lives in [place] with husband and 3 children.  Bird, Dog in house. Homemaker.      Regular Exercise -  YES, 5 days a week   Daily Caffeine Use:  4 cups coffee          Review of Systems: 12 system ROS is negative except as noted above.  Filed Weights   12/14/18 1344  Weight: 184 lb 4 oz (83.6 kg)    Physical Exam: Vital signs were reviewed. General:   Alert, well-nourished, pleasant and cooperative in NAD Head:  Normocephalic and atraumatic. Eyes:  Sclera clear, no icterus.   Conjunctiva pink. Mouth:  No deformity or lesions.   Neck:  Supple; no thyromegaly. Lungs:  Clear throughout to auscultation.   No wheezes.  Heart:  Regular rate and rhythm; no  murmurs Abdomen:  Soft, nontender, normal bowel sounds. No rebound or guarding. No hepatosplenomegaly Rectal:  Deferred  Msk:  Symmetrical without gross deformities. Extremities:  No gross deformities or edema. Neurologic:  Alert and  oriented x4;  grossly nonfocal Skin:  No rash or bruise. Psych:  Alert and cooperative. Normal mood and affect.   Jerod Mcquain L. Tarri Glenn, MD, MPH [facility] 12/14/2018, 2:00 PM

## 2018-12-25 ENCOUNTER — Encounter: Payer: Self-pay | Admitting: Gastroenterology

## 2019-01-08 ENCOUNTER — Encounter: Payer: 59 | Admitting: Gastroenterology

## 2019-02-07 ENCOUNTER — Other Ambulatory Visit: Payer: Self-pay

## 2019-02-07 MED ORDER — HYDROCHLOROTHIAZIDE 12.5 MG PO CAPS: ORAL_CAPSULE | ORAL | 6 refills | Status: DC

## 2019-02-07 MED ORDER — AMLODIPINE BESYLATE 5 MG PO TABS: 5.0000 mg | ORAL_TABLET | Freq: Every day | ORAL | 6 refills | Status: DC

## 2019-02-25 ENCOUNTER — Encounter: Payer: 59 | Admitting: Family

## 2019-03-18 ENCOUNTER — Telehealth: Payer: Self-pay

## 2019-03-18 NOTE — Telephone Encounter (Signed)
Returned patient's call.  Rescheduled physical appt to 05/03/19 @ 9:00 am.

## 2019-03-18 NOTE — Telephone Encounter (Signed)
Copied from CRM [id number]. Topic: General - Other >> Mar 18, 2019  8:19 AM Reggie Pile, NT wrote: Reason for CRM: Attempted to call line 3x. Patient called in and would like to reschedule 5/4 appointment for physical to about a month out. Call back number is [phone].

## 2019-04-01 ENCOUNTER — Encounter: Payer: 59 | Admitting: Family

## 2019-04-24 ENCOUNTER — Other Ambulatory Visit: Payer: Self-pay | Admitting: Family

## 2019-04-26 ENCOUNTER — Other Ambulatory Visit: Payer: Self-pay

## 2019-04-29 ENCOUNTER — Encounter: Payer: Self-pay | Admitting: Obstetrics & Gynecology

## 2019-04-29 ENCOUNTER — Other Ambulatory Visit: Payer: Self-pay

## 2019-04-29 ENCOUNTER — Ambulatory Visit (INDEPENDENT_AMBULATORY_CARE_PROVIDER_SITE_OTHER): Payer: 59 | Admitting: Obstetrics & Gynecology

## 2019-04-29 VITALS — BP 130/80 | Ht 62.75 in | Wt 177.0 lb

## 2019-04-29 DIAGNOSIS — Z01419 Encounter for gynecological examination (general) (routine) without abnormal findings: Secondary | ICD-10-CM | POA: Diagnosis not present

## 2019-04-29 DIAGNOSIS — E6609 Other obesity due to excess calories: Secondary | ICD-10-CM | POA: Diagnosis not present

## 2019-04-29 DIAGNOSIS — Z9189 Other specified personal risk factors, not elsewhere classified: Secondary | ICD-10-CM | POA: Diagnosis not present

## 2019-04-29 DIAGNOSIS — Z6831 Body mass index (BMI) 31.0-31.9, adult: Secondary | ICD-10-CM

## 2019-04-29 NOTE — Progress Notes (Signed)
Deanna Schultz 1971/02/09 [id number]   History:    48 y.o. [id number] Married.  Lost her mother 11/2018. Husband is a Emergency planning/management officer.  Son 45 yo working, daughters 28 and 53 yo.  RP:  Established patient presenting for annual gyn exam   HPI: Menses normal every 4-6 wks, normal flow.  No pelvic pain.  No BTB.  No pain with intercourse.  Vasectomy.  Urine and bowel movements normal.  Breast normal.  BMI 31.60.  Needs to increase physical activities.  Health labs with family physician.  Past medical history,surgical history, family history and social history were all reviewed and documented in the EPIC chart.  Gynecologic History Patient's last menstrual period was 04/14/2019. Contraception: vasectomy Last Pap: 01/2018. Results were: Negative Last mammogram: 01/2019. Results were: Benign Bone Density: Never Colonoscopy: Never  Obstetric History OB History  Gravida Para Term Preterm AB Living  4 3     1 3   SAB TAB Ectopic Multiple Live Births  1            # Outcome Date GA Lbr Len/2nd Weight Sex Delivery Anes PTL Lv  4 SAB           3 Para           2 Para           1 Para              ROS: A ROS was performed and pertinent positives and negatives are included in the history.  GENERAL: No fevers or chills. HEENT: No change in vision, no earache, sore throat or sinus congestion. NECK: No pain or stiffness. CARDIOVASCULAR: No chest pain or pressure. No palpitations. PULMONARY: No shortness of breath, cough or wheeze. GASTROINTESTINAL: No abdominal pain, nausea, vomiting or diarrhea, melena or bright red blood per rectum. GENITOURINARY: No urinary frequency, urgency, hesitancy or dysuria. MUSCULOSKELETAL: No joint or muscle pain, no back pain, no recent trauma. DERMATOLOGIC: No rash, no itching, no lesions. ENDOCRINE: No polyuria, polydipsia, no heat or cold intolerance. No recent change in weight. HEMATOLOGICAL: No anemia or easy bruising or bleeding. NEUROLOGIC: No headache,  seizures, numbness, tingling or weakness. PSYCHIATRIC: No depression, no loss of interest in normal activity or change in sleep pattern.     Exam:   BP 130/80    Ht 5' 2.75" (1.594 m)    Wt 177 lb (80.3 kg)    LMP 04/14/2019    BMI 31.60 kg/m   Body mass index is 31.6 kg/m.  General appearance : Well developed well nourished female. No acute distress HEENT: Eyes: no retinal hemorrhage or exudates,  Neck supple, trachea midline, no carotid bruits, no thyroidmegaly Lungs: Clear to auscultation, no rhonchi or wheezes, or rib retractions  Heart: Regular rate and rhythm, no murmurs or gallops Breast:Examined in sitting and supine position were symmetrical in appearance, no palpable masses or tenderness,  no skin retraction, no nipple inversion, no nipple discharge, no skin discoloration, no axillary or supraclavicular lymphadenopathy Abdomen: no palpable masses or tenderness, no rebound or guarding Extremities: no edema or skin discoloration or tenderness  Pelvic: Vulva: Normal             Vagina: No gross lesions or discharge  Cervix: No gross lesions or discharge  Uterus  AV, normal size, shape and consistency, non-tender and mobile  Adnexa  Without masses or tenderness  Anus: Normal   Assessment/Plan:  48 y.o. female for annual exam   1. Well female  exam with routine gynecological exam Normal gynecologic exam.  Pap test negative in March 2019, no indication to repeat this year.  Breasts normal.  Screening mammogram March 2020 benign.  Health labs with family physician.  2. Relies on partner vasectomy for contraception  3. Class 1 obesity due to excess calories without serious comorbidity with body mass index (BMI) of 31.0 to 31.9 in adult Recommend a lower calorie/carb diet such as [employer] diet.  Increase aerobic activities to 5 times a week and weightlifting every 2 days.  Genia DelMarie-Lyne Carlina Derks MD, 12:36 PM 04/29/2019

## 2019-05-03 ENCOUNTER — Encounter: Payer: 59 | Admitting: Family

## 2019-05-04 ENCOUNTER — Encounter: Payer: Self-pay | Admitting: Obstetrics & Gynecology

## 2019-05-04 NOTE — Patient Instructions (Signed)
1. Well female exam with routine gynecological exam Normal gynecologic exam.  Pap test negative in March 2019, no indication to repeat this year.  Breasts normal.  Screening mammogram March 2020 benign.  Health labs with family physician.  2. Relies on partner vasectomy for contraception  3. Class 1 obesity due to excess calories without serious comorbidity with body mass index (BMI) of 31.0 to 31.9 in adult Recommend a lower calorie/carb diet such as [employer].  Increase aerobic activities to 5 times a week and weightlifting every 2 days.  Deanna Schultz, it was a pleasure seeing you today!

## 2019-05-21 ENCOUNTER — Other Ambulatory Visit: Payer: Self-pay

## 2019-05-22 ENCOUNTER — Encounter: Payer: Self-pay | Admitting: Family

## 2019-05-22 ENCOUNTER — Ambulatory Visit (INDEPENDENT_AMBULATORY_CARE_PROVIDER_SITE_OTHER): Payer: 59 | Admitting: Family

## 2019-05-22 ENCOUNTER — Other Ambulatory Visit: Payer: Self-pay

## 2019-05-22 VITALS — BP 132/72 | HR 70 | Temp 98.0°F | Ht 62.75 in | Wt 179.0 lb

## 2019-05-22 DIAGNOSIS — Z0000 Encounter for general adult medical examination without abnormal findings: Secondary | ICD-10-CM

## 2019-05-22 DIAGNOSIS — F411 Generalized anxiety disorder: Secondary | ICD-10-CM | POA: Diagnosis not present

## 2019-05-22 DIAGNOSIS — I10 Essential (primary) hypertension: Secondary | ICD-10-CM

## 2019-05-22 LAB — COMPREHENSIVE METABOLIC PANEL
ALT: 16 U/L (ref 0–35)
AST: 19 U/L (ref 0–37)
Albumin: 4.4 g/dL (ref 3.5–5.2)
Alkaline Phosphatase: 49 U/L (ref 39–117)
BUN: 12 mg/dL (ref 6–23)
CO2: 26 mEq/L (ref 19–32)
Calcium: 9.2 mg/dL (ref 8.4–10.5)
Chloride: 101 mEq/L (ref 96–112)
Creatinine, Ser: 0.6 mg/dL (ref 0.40–1.20)
GFR: 106.4 mL/min (ref >60.00)
Glucose, Bld: 79 mg/dL (ref 70–99)
Potassium: 3.9 mEq/L (ref 3.5–5.1)
Sodium: 137 mEq/L (ref 135–145)
Total Bilirubin: 0.6 mg/dL (ref 0.2–1.2)
Total Protein: 7.5 g/dL (ref 6.0–8.3)

## 2019-05-22 LAB — LIPID PANEL
Cholesterol: 225 mg/dL — ABNORMAL HIGH (ref 0–200)
HDL: 60.1 mg/dL (ref >39.00)
LDL Cholesterol: 131 mg/dL — ABNORMAL HIGH (ref 0–99)
NonHDL: 164.88
Total CHOL/HDL Ratio: 4
Triglycerides: 168 mg/dL — ABNORMAL HIGH (ref 0.0–149.0)
VLDL: 33.6 mg/dL (ref 0.0–40.0)

## 2019-05-22 LAB — CBC WITH DIFFERENTIAL/PLATELET
Basophils Absolute: 0 10*3/uL (ref 0.0–0.1)
Basophils Relative: 0.3 % (ref 0.0–3.0)
Eosinophils Absolute: 0 10*3/uL (ref 0.0–0.7)
Eosinophils Relative: 0.7 % (ref 0.0–5.0)
HCT: 40.9 % (ref 36.0–46.0)
Hemoglobin: 13.7 g/dL (ref 12.0–15.0)
Lymphocytes Relative: 30.3 % (ref 12.0–46.0)
Lymphs Abs: 2.2 10*3/uL (ref 0.7–4.0)
MCHC: 33.5 g/dL (ref 30.0–36.0)
MCV: 93.5 fl (ref 78.0–100.0)
Monocytes Absolute: 0.7 10*3/uL (ref 0.1–1.0)
Monocytes Relative: 9.2 % (ref 3.0–12.0)
Neutro Abs: 4.3 10*3/uL (ref 1.4–7.7)
Neutrophils Relative %: 59.5 % (ref 43.0–77.0)
Platelets: 252 10*3/uL (ref 150.0–400.0)
RBC: 4.37 Mil/uL (ref 3.87–5.11)
RDW: 14.6 % (ref 11.5–15.5)
WBC: 7.3 10*3/uL (ref 4.0–10.5)

## 2019-05-22 LAB — VITAMIN D 25 HYDROXY (VIT D DEFICIENCY, FRACTURES): VITD: 29.9 ng/mL — ABNORMAL LOW (ref 30.00–100.00)

## 2019-05-22 LAB — TSH: TSH: 0.87 u[IU]/mL (ref 0.35–4.50)

## 2019-05-22 MED ORDER — SERTRALINE HCL 50 MG PO TABS: 50.0000 mg | ORAL_TABLET | Freq: Every day | ORAL | 3 refills | Status: DC

## 2019-05-22 NOTE — Assessment & Plan Note (Signed)
Trial of zoloft. Close follow up in 6-8 weeks.

## 2019-05-22 NOTE — Assessment & Plan Note (Addendum)
Slightly elevated. Discussed lifestyle modifications including weight loss, low salt diet. She will keep blood pressure log and call me with readings so we can adjust medications if needed.

## 2019-05-22 NOTE — Progress Notes (Signed)
Subjective:    Patient ID: Deanna Schultz, female    DOB: [birth year], 48 y.o.   MRN: [id number]  CC: Deanna Schultz is a 48 y.o. female who presents today for physical exam.    HPI:   HTN- compliant with medication. Not checking at home.  Denies exertional chest pain or pressure, numbness or tingling radiating to left arm or jaw, palpitations, dizziness, frequent headaches, changes in vision, or shortness of breath.   Some trouble with staying asleep. Increased anxiety of late with concern over husband Adult nurse) and father ( in [place]) whom recently placed in nursing home . No si/hi.    Colorectal Cancer Screening:planning first colonoscopy; met with GI, Dr Tarri Glenn. father and mother has polyps Breast Cancer Screening: Mammogram UTD Cervical Cancer Screening: UTD; she is still having normal menses. No hot flashes.   follows with GYN  Immunizations       Tetanus - utd      Labs: Screening labs today. Exercise: Gets regular exercise.  Alcohol use: occasional Smoking/tobacco use: Nonsmoker.  Regular dental exams: UTD  Skin: has dermatologist to call; doesn't need referral.  Wears seat belt: Yes.  HISTORY:  Past Medical History:  Diagnosis Date  . Hyperlipemia   . IBS (irritable bowel syndrome)   . Placenta previa     Past Surgical History:  Procedure Laterality Date  . CESAREAN SECTION     x 1   . DILATION AND CURETTAGE OF UTERUS  06/2002   due to miscarriage  . VAGINAL DELIVERY     x 2   Family History  Problem Relation Age of Onset  . Hyperlipidemia Mother   . Hypertension Mother   . Diabetes Mother   . Colon polyps Mother   . Heart disease Mother   . Hypertension Father   . Heart disease Father   . Hyperlipidemia Father   . Arthritis Father   . Colon polyps Father   . Cancer Brother 25       Brain Tumor, GBM  . Hypertension Brother   . Heart disease Maternal Grandfather       ALLERGIES: Patient has no known allergies.  Current  Outpatient Medications on File Prior to Visit  Medication Sig Dispense Refill  . amLODipine (NORVASC) 5 MG tablet Take 1 tablet (5 mg total) by mouth daily. 30 tablet 6  . hydrochlorothiazide (MICROZIDE) 12.5 MG capsule TAKE ONE CAPSULE BY MOUTH ONE TIME DAILY 30 capsule 6  . LINZESS 145 MCG CAPS capsule TAKE 1 CAPSULE BY MOUTH  DAILY 90 capsule 1  . meclizine (ANTIVERT) 12.5 MG tablet Take 1 tablet (12.5 mg total) by mouth 3 (three) times daily as needed for dizziness. 30 tablet 0  . Omeprazole-Sodium Bicarbonate (ZEGERID) 20-1100 MG CAPS Take 1 capsule by mouth daily before breakfast. 28 each 3   No current facility-administered medications on file prior to visit.     Social History   Tobacco Use  . Smoking status: Never Smoker  . Smokeless tobacco: Never Used  Substance Use Topics  . Alcohol use: Yes    Alcohol/week: 0.0 standard drinks    Comment: 2-3 DRINKS A WEEK  . Drug use: No    Review of Systems  Constitutional: Negative for chills, fever and unexpected weight change.  HENT: Negative for congestion.   Respiratory: Negative for cough.   Cardiovascular: Negative for chest pain, palpitations and leg swelling.  Gastrointestinal: Negative for nausea and vomiting.  Musculoskeletal: Negative for arthralgias and  myalgias.  Skin: Negative for rash.  Neurological: Negative for headaches.  Hematological: Negative for adenopathy.  Psychiatric/Behavioral: Positive for sleep disturbance. Negative for confusion and suicidal ideas. The patient is nervous/anxious.       Objective:    BP 132/72   Pulse 70   Temp 98 F (36.7 C)   Ht 5' 2.75" (1.594 m)   Wt 179 lb (81.2 kg)   SpO2 98%   BMI 31.96 kg/m   BP Readings from Last 3 Encounters:  05/22/19 132/72  04/29/19 130/80  12/14/18 (!) 160/88   Wt Readings from Last 3 Encounters:  05/22/19 179 lb (81.2 kg)  04/29/19 177 lb (80.3 kg)  12/14/18 184 lb 4 oz (83.6 kg)    Physical Exam Vitals signs reviewed.   Constitutional:      Appearance: She is well-developed.  Eyes:     Conjunctiva/sclera: Conjunctivae normal.  Neck:     Thyroid: No thyroid mass or thyromegaly.  Cardiovascular:     Rate and Rhythm: Normal rate and regular rhythm.     Pulses: Normal pulses.     Heart sounds: Normal heart sounds.  Pulmonary:     Effort: Pulmonary effort is normal.     Breath sounds: Normal breath sounds. No wheezing, rhonchi or rales.  Chest:     Breasts: Breasts are symmetrical.        Right: No inverted nipple, mass, nipple discharge, skin change or tenderness.        Left: No inverted nipple, mass, nipple discharge, skin change or tenderness.  Lymphadenopathy:     Head:     Right side of head: No submental, submandibular, tonsillar, preauricular, posterior auricular or occipital adenopathy.     Left side of head: No submental, submandibular, tonsillar, preauricular, posterior auricular or occipital adenopathy.     Cervical: No cervical adenopathy.     Right cervical: No superficial, deep or posterior cervical adenopathy.    Left cervical: No superficial, deep or posterior cervical adenopathy.  Skin:    General: Skin is warm and dry.  Neurological:     Mental Status: She is alert.  Psychiatric:        Speech: Speech normal.        Behavior: Behavior normal.        Thought Content: Thought content normal.        Assessment & Plan:   Problem List Items Addressed This Visit      Cardiovascular and Mediastinum   Essential hypertension, benign (Chronic)    Slightly elevated. Discussed lifestyle modifications including weight loss, low salt diet. She will keep blood pressure log and call me with readings so we can adjust medications if needed.         Other   Routine general medical examination at a health care facility - Primary    CBE performed; deferred pelvic exam in absence of complaints, and pap utd.       Relevant Orders   CBC with Differential/Platelet   Comprehensive metabolic  panel   Hemoglobin A1c   Lipid panel   TSH   VITAMIN D 25 Hydroxy (Vit-D Deficiency, Fractures)   GAD (generalized anxiety disorder)    Trial of zoloft. Close follow up in 6-8 weeks.       Relevant Medications   sertraline (ZOLOFT) 50 MG tablet       I am having Deanna Schultz start on sertraline. I am also having her maintain her Omeprazole-Sodium Bicarbonate, meclizine, hydrochlorothiazide, amLODipine, and Linzess.  Meds ordered this encounter  Medications  . sertraline (ZOLOFT) 50 MG tablet    Sig: Take 1 tablet (50 mg total) by mouth at bedtime.    Dispense:  90 tablet    Refill:  3    Order Specific Question:   Supervising Provider    Answer:   Crecencio Mc [[id number]]    Return precautions given.   Risks, benefits, and alternatives of the medications and treatment plan prescribed today were discussed, and patient expressed understanding.   Education regarding symptom management and diagnosis given to patient on AVS.   Continue to follow with Burnard Hawthorne, FNP for routine health maintenance.   Deanna Schultz and I agreed with plan.   Mable Paris, FNP

## 2019-05-22 NOTE — Assessment & Plan Note (Signed)
CBE performed; deferred pelvic exam in absence of complaints, and pap utd.

## 2019-05-22 NOTE — Patient Instructions (Addendum)
Trial of zoloft  Follow up in 6-8 weeks.  Let me know how you are.   Stay safe! Health Maintenance, Female Adopting a healthy lifestyle and getting preventive care can go a long way to promote health and wellness. Talk with your health care provider about what schedule of regular examinations is right for you. This is a good chance for you to check in with your provider about disease prevention and staying healthy. In between checkups, there are plenty of things you can do on your own. Experts have done a lot of research about which lifestyle changes and preventive measures are most likely to keep you healthy. Ask your health care provider for more information. Weight and diet Eat a healthy diet  Be sure to include plenty of vegetables, fruits, low-fat dairy products, and lean protein.  Do not eat a lot of foods high in solid fats, added sugars, or salt.  Get regular exercise. This is one of the most important things you can do for your health. ? Most adults should exercise for at least 150 minutes each week. The exercise should increase your heart rate and make you sweat (moderate-intensity exercise). ? Most adults should also do strengthening exercises at least twice a week. This is in addition to the moderate-intensity exercise. Maintain a healthy weight  Body mass index (BMI) is a measurement that can be used to identify possible weight problems. It estimates body fat based on height and weight. Your health care provider can help determine your BMI and help you achieve or maintain a healthy weight.  For females 83 years of age and older: ? A BMI below 18.5 is considered underweight. ? A BMI of 18.5 to 24.9 is normal. ? A BMI of 25 to 29.9 is considered overweight. ? A BMI of 30 and above is considered obese. Watch levels of cholesterol and blood lipids  You should start having your blood tested for lipids and cholesterol at 49 years of age, then have this test every 5 years.  You  may need to have your cholesterol levels checked more often if: ? Your lipid or cholesterol levels are high. ? You are older than 49 years of age. ? You are at high risk for heart disease. Cancer screening Lung Cancer  Lung cancer screening is recommended for adults 88-39 years old who are at high risk for lung cancer because of a history of smoking.  A yearly low-dose CT scan of the lungs is recommended for people who: ? Currently smoke. ? Have quit within the past 15 years. ? Have at least a 30-pack-year history of smoking. A pack year is smoking an average of one pack of cigarettes a day for 1 year.  Yearly screening should continue until it has been 15 years since you quit.  Yearly screening should stop if you develop a health problem that would prevent you from having lung cancer treatment. Breast Cancer  Practice breast self-awareness. This means understanding how your breasts normally appear and feel.  It also means doing regular breast self-exams. Let your health care provider know about any changes, no matter how small.  If you are in your 20s or 30s, you should have a clinical breast exam (CBE) by a health care provider every 1-3 years as part of a regular health exam.  If you are 26 or older, have a CBE every year. Also consider having a breast X-ray (mammogram) every year.  If you have a family history of breast cancer,  talk to your health care provider about genetic screening.  If you are at high risk for breast cancer, talk to your health care provider about having an MRI and a mammogram every year.  Breast cancer gene (BRCA) assessment is recommended for women who have family members with BRCA-related cancers. BRCA-related cancers include: ? Breast. ? Ovarian. ? Tubal. ? Peritoneal cancers.  Results of the assessment will determine the need for genetic counseling and BRCA1 and BRCA2 testing. Cervical Cancer Your health care provider may recommend that you be  screened regularly for cancer of the pelvic organs (ovaries, uterus, and vagina). This screening involves a pelvic examination, including checking for microscopic changes to the surface of your cervix (Pap test). You may be encouraged to have this screening done every 3 years, beginning at age 60.  For women ages 70-65, health care providers may recommend pelvic exams and Pap testing every 3 years, or they may recommend the Pap and pelvic exam, combined with testing for human papilloma virus (HPV), every 5 years. Some types of HPV increase your risk of cervical cancer. Testing for HPV may also be done on women of any age with unclear Pap test results.  Other health care providers may not recommend any screening for nonpregnant women who are considered low risk for pelvic cancer and who do not have symptoms. Ask your health care provider if a screening pelvic exam is right for you.  If you have had past treatment for cervical cancer or a condition that could lead to cancer, you need Pap tests and screening for cancer for at least 20 years after your treatment. If Pap tests have been discontinued, your risk factors (such as having a new sexual partner) need to be reassessed to determine if screening should resume. Some women have medical problems that increase the chance of getting cervical cancer. In these cases, your health care provider may recommend more frequent screening and Pap tests. Colorectal Cancer  This type of cancer can be detected and often prevented.  Routine colorectal cancer screening usually begins at 49 years of age and continues through 49 years of age.  Your health care provider may recommend screening at an earlier age if you have risk factors for colon cancer.  Your health care provider may also recommend using home test kits to check for hidden blood in the stool.  A small camera at the end of a tube can be used to examine your colon directly (sigmoidoscopy or colonoscopy).  This is done to check for the earliest forms of colorectal cancer.  Routine screening usually begins at age 19.  Direct examination of the colon should be repeated every 5-10 years through 49 years of age. However, you may need to be screened more often if early forms of precancerous polyps or small growths are found. Skin Cancer  Check your skin from head to toe regularly.  Tell your health care provider about any new moles or changes in moles, especially if there is a change in a mole's shape or color.  Also tell your health care provider if you have a mole that is larger than the size of a pencil eraser.  Always use sunscreen. Apply sunscreen liberally and repeatedly throughout the day.  Protect yourself by wearing long sleeves, pants, a wide-brimmed hat, and sunglasses whenever you are outside. Heart disease, diabetes, and high blood pressure  High blood pressure causes heart disease and increases the risk of stroke. High blood pressure is more likely to develop  in: ? People who have blood pressure in the high end of the normal range (130-139/85-89 mm Hg). ? People who are overweight or obese. ? People who are African American.  If you are 77-35 years of age, have your blood pressure checked every 3-5 years. If you are 85 years of age or older, have your blood pressure checked every year. You should have your blood pressure measured twice-once when you are at a hospital or clinic, and once when you are not at a hospital or clinic. Record the average of the two measurements. To check your blood pressure when you are not at a hospital or clinic, you can use: ? An automated blood pressure machine at a pharmacy. ? A home blood pressure monitor.  If you are between 51 years and 52 years old, ask your health care provider if you should take aspirin to prevent strokes.  Have regular diabetes screenings. This involves taking a blood sample to check your fasting blood sugar level. ? If you  are at a normal weight and have a low risk for diabetes, have this test once every three years after 49 years of age. ? If you are overweight and have a high risk for diabetes, consider being tested at a younger age or more often. Preventing infection Hepatitis B  If you have a higher risk for hepatitis B, you should be screened for this virus. You are considered at high risk for hepatitis B if: ? You were born in a country where hepatitis B is common. Ask your health care provider which countries are considered high risk. ? Your parents were born in a high-risk country, and you have not been immunized against hepatitis B (hepatitis B vaccine). ? You have HIV or AIDS. ? You use needles to inject street drugs. ? You live with someone who has hepatitis B. ? You have had sex with someone who has hepatitis B. ? You get hemodialysis treatment. ? You take certain medicines for conditions, including cancer, organ transplantation, and autoimmune conditions. Hepatitis C  Blood testing is recommended for: ? Everyone born from [birth year] through 1966. ? Anyone with known risk factors for hepatitis C. Sexually transmitted infections (STIs)  You should be screened for sexually transmitted infections (STIs) including gonorrhea and chlamydia if: ? You are sexually active and are younger than 49 years of age. ? You are older than 49 years of age and your health care provider tells you that you are at risk for this type of infection. ? Your sexual activity has changed since you were last screened and you are at an increased risk for chlamydia or gonorrhea. Ask your health care provider if you are at risk.  If you do not have HIV, but are at risk, it may be recommended that you take a prescription medicine daily to prevent HIV infection. This is called pre-exposure prophylaxis (PrEP). You are considered at risk if: ? You are sexually active and do not regularly use condoms or know the HIV status of your  partner(s). ? You take drugs by injection. ? You are sexually active with a partner who has HIV. Talk with your health care provider about whether you are at high risk of being infected with HIV. If you choose to begin PrEP, you should first be tested for HIV. You should then be tested every 3 months for as long as you are taking PrEP. Pregnancy  If you are premenopausal and you may become pregnant, ask your health care  provider about preconception counseling.  If you may become pregnant, take 400 to 800 micrograms (mcg) of folic acid every day.  If you want to prevent pregnancy, talk to your health care provider about birth control (contraception). Osteoporosis and menopause  Osteoporosis is a disease in which the bones lose minerals and strength with aging. This can result in serious bone fractures. Your risk for osteoporosis can be identified using a bone density scan.  If you are 89 years of age or older, or if you are at risk for osteoporosis and fractures, ask your health care provider if you should be screened.  Ask your health care provider whether you should take a calcium or vitamin D supplement to lower your risk for osteoporosis.  Menopause may have certain physical symptoms and risks.  Hormone replacement therapy may reduce some of these symptoms and risks. Talk to your health care provider about whether hormone replacement therapy is right for you. Follow these instructions at home:  Schedule regular health, dental, and eye exams.  Stay current with your immunizations.  Do not use any tobacco products including cigarettes, chewing tobacco, or electronic cigarettes.  If you are pregnant, do not drink alcohol.  If you are breastfeeding, limit how much and how often you drink alcohol.  Limit alcohol intake to no more than 1 drink per day for nonpregnant women. One drink equals 12 ounces of beer, 5 ounces of wine, or 1 ounces of hard liquor.  Do not use street  drugs.  Do not share needles.  Ask your health care provider for help if you need support or information about quitting drugs.  Tell your health care provider if you often feel depressed.  Tell your health care provider if you have ever been abused or do not feel safe at home. This information is not intended to replace advice given to you by your health care provider. Make sure you discuss any questions you have with your health care provider. Document Released: 05/30/2011 Document Revised: 04/21/2016 Document Reviewed: 08/18/2015 Elsevier Interactive Patient Education  2019 [employer].

## 2019-05-23 LAB — HEMOGLOBIN A1C: Hgb A1c MFr Bld: 5.8 % (ref 4.6–6.5)

## 2019-05-29 ENCOUNTER — Other Ambulatory Visit: Payer: Self-pay | Admitting: Family

## 2019-05-29 ENCOUNTER — Encounter: Payer: Self-pay | Admitting: Family

## 2019-05-29 DIAGNOSIS — R7303 Prediabetes: Secondary | ICD-10-CM

## 2019-07-03 ENCOUNTER — Encounter: Payer: Self-pay | Admitting: Family

## 2019-07-03 ENCOUNTER — Ambulatory Visit (INDEPENDENT_AMBULATORY_CARE_PROVIDER_SITE_OTHER): Payer: 59 | Admitting: Family

## 2019-07-03 ENCOUNTER — Other Ambulatory Visit: Payer: Self-pay

## 2019-07-03 DIAGNOSIS — I10 Essential (primary) hypertension: Secondary | ICD-10-CM | POA: Diagnosis not present

## 2019-07-03 DIAGNOSIS — F411 Generalized anxiety disorder: Secondary | ICD-10-CM

## 2019-07-03 MED ORDER — SERTRALINE HCL 100 MG PO TABS: 100.0000 mg | ORAL_TABLET | Freq: Every day | ORAL | 1 refills | Status: DC

## 2019-07-03 NOTE — Assessment & Plan Note (Signed)
Close to goal. Aggressively loosing weight so hesitant to increase medications which patient agrees with. Her preference is continue regimen for now.  She will monitor blood pressure at home.

## 2019-07-03 NOTE — Patient Instructions (Addendum)
Monitor blood pressure,  Goal is less than 120/80, based on newest guidelines; if persistently higher, please make sooner follow up appointment so we can recheck you blood pressure and manage medications  Pleased with weight loss - that is wonderful!  May start 100mg  zoloft.   Let me know how you are doing .  Stay safe!

## 2019-07-03 NOTE — Assessment & Plan Note (Signed)
Improvement with zoloft, will go ahead and increase to 100mg . She will let me know how she is doing.

## 2019-07-03 NOTE — Progress Notes (Signed)
This visit type was conducted due to national recommendations for restrictions regarding the COVID-19 pandemic (e.g. social distancing).  This format is felt to be most appropriate for this patient at this time.  All issues noted in this document were discussed and addressed.  No physical exam was performed (except for noted visual exam findings with Video Visits). Virtual Visit via Video Note  I connected with@  on 07/03/19 at 10:30 AM EDT by a video enabled telemedicine application and verified that I am speaking with the correct person using two identifiers.  Location patient: home Location provider:work  Persons participating in the virtual visit: patient, provider  I discussed the limitations of evaluation and management by telemedicine and the availability of in person appointments. The patient expressed understanding and agreed to proceed.   HPI:  Feels well. No complaints today.   HTN- at home 133/79, HR 70.  170.2 lb. ( lost 9 lbs)  Seeing nutritionist this week.  GAD - improved. 'Feels more even' since starting on zoloft . Sleep is unchanged. Continues to have trouble falling asleep.   ROS: See pertinent positives and negatives per HPI.  Past Medical History:  Diagnosis Date  . Hyperlipemia   . IBS (irritable bowel syndrome)   . Placenta previa     Past Surgical History:  Procedure Laterality Date  . CESAREAN SECTION     x 1   . DILATION AND CURETTAGE OF UTERUS  06/2002   due to miscarriage  . VAGINAL DELIVERY     x 2    Family History  Problem Relation Age of Onset  . Hyperlipidemia Mother   . Hypertension Mother   . Diabetes Mother   . Colon polyps Mother   . Heart disease Mother   . Hypertension Father   . Heart disease Father   . Hyperlipidemia Father   . Arthritis Father   . Colon polyps Father   . Cancer Brother 25       Brain Tumor, GBM  . Hypertension Brother   . Heart disease Maternal Grandfather     SOCIAL HX: never smoker   Current  Outpatient Medications:  .  amLODipine (NORVASC) 5 MG tablet, Take 1 tablet (5 mg total) by mouth daily., Disp: 30 tablet, Rfl: 6 .  hydrochlorothiazide (MICROZIDE) 12.5 MG capsule, TAKE ONE CAPSULE BY MOUTH ONE TIME DAILY, Disp: 30 capsule, Rfl: 6 .  LINZESS 145 MCG CAPS capsule, TAKE 1 CAPSULE BY MOUTH  DAILY, Disp: 90 capsule, Rfl: 1 .  meclizine (ANTIVERT) 12.5 MG tablet, Take 1 tablet (12.5 mg total) by mouth 3 (three) times daily as needed for dizziness., Disp: 30 tablet, Rfl: 0 .  Omeprazole-Sodium Bicarbonate (ZEGERID) 20-1100 MG CAPS, Take 1 capsule by mouth daily before breakfast., Disp: 28 each, Rfl: 3 .  sertraline (ZOLOFT) 100 MG tablet, Take 1 tablet (100 mg total) by mouth at bedtime., Disp: 90 tablet, Rfl: 1  EXAM:  VITALS per patient if applicable: BP Readings from Last 3 Encounters:  05/22/19 132/72  04/29/19 130/80  12/14/18 (!) 160/88   Wt Readings from Last 3 Encounters:  05/22/19 179 lb (81.2 kg)  04/29/19 177 lb (80.3 kg)  12/14/18 184 lb 4 oz (83.6 kg)     GENERAL: alert, oriented, appears well and in no acute distress  HEENT: atraumatic, conjunttiva clear, no obvious abnormalities on inspection of external nose and ears  NECK: normal movements of the head and neck  LUNGS: on inspection no signs of respiratory distress, breathing rate  appears normal, no obvious gross SOB, gasping or wheezing  CV: no obvious cyanosis  MS: moves all visible extremities without noticeable abnormality  PSYCH/NEURO: pleasant and cooperative, no obvious depression or anxiety, speech and thought processing grossly intact  ASSESSMENT AND PLAN:  Discussed the following assessment and plan:  Problem List Items Addressed This Visit      Cardiovascular and Mediastinum   Essential hypertension, benign (Chronic)    Close to goal. Aggressively loosing weight so hesitant to increase medications which patient agrees with. Her preference is continue regimen for now.  She will  monitor blood pressure at home.         Other   GAD (generalized anxiety disorder) - Primary    Improvement with zoloft, will go ahead and increase to 100mg . She will let me know how she is doing.      Relevant Medications   sertraline (ZOLOFT) 100 MG tablet        I discussed the assessment and treatment plan with the patient. The patient was provided an opportunity to ask questions and all were answered. The patient agreed with the plan and demonstrated an understanding of the instructions.   The patient was advised to call back or seek an in-person evaluation if the symptoms worsen or if the condition fails to improve as anticipated.   Mable Paris, FNP

## 2019-07-05 ENCOUNTER — Encounter: Payer: 59 | Attending: Family | Admitting: Dietician

## 2019-07-05 ENCOUNTER — Encounter: Payer: Self-pay | Admitting: Dietician

## 2019-07-05 ENCOUNTER — Other Ambulatory Visit: Payer: Self-pay

## 2019-07-05 VITALS — Ht 63.0 in | Wt 173.0 lb

## 2019-07-05 DIAGNOSIS — R7303 Prediabetes: Secondary | ICD-10-CM | POA: Insufficient documentation

## 2019-07-05 NOTE — Progress Notes (Signed)
Medical Nutrition Therapy: Visit start time: 0900  end time: 1000  Assessment:  Diagnosis: pre-diabetes Past medical history: HTN, GERD Psychosocial issues/ stress concerns: death of mother recently  Preferred learning method:  . Auditory . Visual . Hands-on   Current weight: 173.0lbs Height: 5'3" Medications, supplements: reconciled list in medical record  Progress and evaluation:   Patient reports HbA1C of 5.5 -5.6% for several years, recently increased to 5.7%  She reports regular exercise for at least 5 years.   Has been working on weight loss x1 year, lost 12-15lbs so far. She has decreased sugar intake despite an increase in cravings for sweets, and has decreased some food portions.   She reports irregular menstrual cycle in past months.   Recent death of her mother has increased stress for patient, has started taking Sertraline and is feeling better.   Physical activity: walking 1 hour 5x a week (used to exercise at gym)  Dietary Intake:  Usual eating pattern includes 94meals and 1 snacks per day. Dining out frequency: 2-3 meals per week.  Breakfast: coffee with unsweetened almond milk =/or sugar free creamer Eng muffin with egg, canadian bacon, cheese Snack: none Lunch: sandwich croissant with 1/2pc cheese, ham, stopped chips recently, carrots/ veg with dip, sweet Snack: none Supper: cooks usually --  Snack: occ swedish fish, does have 1 alcohol containing drink Beverages: water, crystal light, diet cranberry-grape juice, sugar free soda maybe once a week, sweet tea only occasionally  Nutrition Care Education: Topics covered: pre-diabetes, weight control Basic nutrition: basic food groups, appropriate nutrient balance, appropriate meal and snack schedule, general nutrition guidelines    Weight control: benefits of weight control in reducing diabetes risk, calculated energy needs for weight loss at 1300-1500kcal daily; discussed tracking food intake using phone app,  strategies for making healthy changes. Pre-Diabetes:  appropriate meal and snack schedule, appropriate carb intake and balance, healthy carb choices, benefit of increased fiber intake, meal planning using plate method and food models, balanced meal options, portion control, importance of low sugar and low fat intake.    Nutritional Diagnosis:  [place]-2.2 Altered nutrition-related laboratory As related to pre-diabetes.  As evidenced by patient with recent HbA1C of 5.7%. [place]-3.3 Overweight/obesity As related to history of excess calories, stress.  As evidenced by patient with current BMI of 30.65, working on diet and lifestyle changes for ongoing weight loss.  Intervention:   Instruction and discussion as noted above.  Patient has been working on healthy lifestyle changes already and is motivated to continue.  Established goals for further dietary improvement.   Education Materials given:  . General diet guidelines for Diabetes . Plate Planner with food lists . Sample meal pattern/ menus . Goals/ instructions   Learner/ who was taught:  . Patient   Level of understanding: Marland Kitchen Verbalizes/ demonstrates competency   Demonstrated degree of understanding via:   Teach back Learning barriers: . None  Willingness to learn/ readiness for change: . Eager, change in progress   Monitoring and Evaluation:  Dietary intake, exercise, BG control, and body weight      follow up: 08/09/19 at 9:00am

## 2019-07-05 NOTE — Patient Instructions (Signed)
   Control intake of sweets by limiting portions, eating less often, or have a healthier (lower fat/ lower sugar) option.   Include plenty of low-carb veggies by eating generous portions and adding to every lunch and dinner meal. Can also snack on veggies with small amount of dip, or use hummus for dipping.   Continue with healthy food choices and regular exercise, great job!

## 2019-07-08 ENCOUNTER — Other Ambulatory Visit: Payer: Self-pay

## 2019-08-09 ENCOUNTER — Other Ambulatory Visit: Payer: Self-pay

## 2019-08-09 ENCOUNTER — Encounter: Payer: Self-pay | Admitting: Dietician

## 2019-08-09 ENCOUNTER — Encounter: Payer: 59 | Attending: Family | Admitting: Dietician

## 2019-08-09 VITALS — Ht 63.0 in | Wt 165.0 lb

## 2019-08-09 DIAGNOSIS — R7303 Prediabetes: Secondary | ICD-10-CM | POA: Diagnosis present

## 2019-08-09 NOTE — Progress Notes (Signed)
Medical Nutrition Therapy: Visit start time: 0900  end time: 0930  Assessment:  Diagnosis: pre-diabetes Medical history changes: no changes Psychosocial issues/ stress concerns: none  Current weight: 165.0lbs  Height: 5'3" Medications, supplement changes: no changes  Progress and evaluation:   Weight loss of 8lbs since previous visit on 07/05/19, feels it might be due to taking Sertraline.    Patient reports working on improving nutritional balance in meals; fewer sweet snacks.  She voices some confusion over carb portions and recommendations.   Physical activity: walking 30+ minutes, 5x a week  Dietary Intake:  Usual eating pattern includes 3 meals and 0-1 snacks per day. Dining out frequency: not assessed today.  Breakfast: eggs (1 whole egg + egg white) with spinach, onion; oatmeal with blueberries and peanut butter 1 tsp Snack: none Lunch: protein + veg; occ sandwich, trying to decrease Snack: none Supper: large veg portions + protein + sometimes small portion starch ie whole grain pasta Snack: no swedish fish recently; low sugar snack ie low sugar licorice; occ crystal light with rum  Beverages: water, crystal light, diet juice, rarely diet soda; no sweet tea recently  Nutrition Care Education: Topics covered: diabetes prevention, weight control Basic nutrition: basic food groups, appropriate nutrient balance Weight control: appropriate weight loss rate; strategies for long-term weight loss success and avoiding weight re-gain Advanced nutrition:  Recipes and cooking options Diabetes prevention: appropriate carb intake and balance, healthy carb choices, refined/ process vs unrefined choices, benefits of high fiber foods   Nutritional Diagnosis:  [place]-2.2 Altered nutrition-related laboratory As related to pre-diabetes.  As evidenced by patient with recent HbA1C of 5.8%. [place]-3.3 Overweight/obesity As related to history of excess calories and inactivity.  As evidenced by patient  with current BMI of 29.2, working on dietary and lifestyle modifications for ongoing weight loss.  Intervention:   Instruction and discussion as noted above.  Commended patient for ongoing efforts at dietary improvement and successful weight loss.  Goal is to continue with current healthy lifestyle habits.   No additional follow-up needed at this time.   Education Materials given:  Marland Kitchen Diabetes-friendly Recipes . Carb Counting and Meal Planning (Novo) . Goals/ instructions  Learner/ who was taught:  . Patient    Level of understanding: Marland Kitchen Verbalizes/ demonstrates competency   Demonstrated degree of understanding via:   Teach back Learning barriers: . None  Willingness to learn/ readiness for change: . Eager, change in progress  Monitoring and Evaluation:  Dietary intake, exercise, BG control, and body weight      follow up: prn

## 2019-08-09 NOTE — Patient Instructions (Signed)
   Great job making healthy food choices and controlling carb intake, keep it up!  Maintain regular exercise to prevent diabetes and stay healthy

## 2019-09-04 ENCOUNTER — Other Ambulatory Visit: Payer: Self-pay | Admitting: Family

## 2019-09-10 ENCOUNTER — Other Ambulatory Visit: Payer: Self-pay

## 2019-09-10 NOTE — Telephone Encounter (Signed)
Appointment scheduled for October 15 at 10:00.

## 2019-09-12 ENCOUNTER — Other Ambulatory Visit: Payer: Self-pay

## 2019-09-12 ENCOUNTER — Ambulatory Visit: Payer: 59 | Admitting: Obstetrics & Gynecology

## 2019-09-12 ENCOUNTER — Encounter: Payer: Self-pay | Admitting: Obstetrics & Gynecology

## 2019-09-12 VITALS — BP 130/80

## 2019-09-12 DIAGNOSIS — N914 Secondary oligomenorrhea: Secondary | ICD-10-CM

## 2019-09-12 LAB — PREGNANCY, URINE: Preg Test, Ur: NEGATIVE

## 2019-09-12 MED ORDER — MEDROXYPROGESTERONE ACETATE 5 MG PO TABS: 5.0000 mg | ORAL_TABLET | Freq: Every day | ORAL | 4 refills | Status: DC

## 2019-09-12 NOTE — Progress Notes (Signed)
    Deanna Schultz 06-13-1971 [id number]        48 y.o.  G4P0013 married.  Vasectomy.  RP: Oligomenorrhea with no menstrual period since early July 2020  HPI: Oligomenorrhea with last menstrual period June 04, 2019.  Normal flow at that time.  Her menses have been every 2 to 3 months for the last year.  No excess flow.  No pelvic pain.  No hot flashes or night sweats.  No pain with intercourse.   OB History  Gravida Para Term Preterm AB Living  4 3     1 3   SAB TAB Ectopic Multiple Live Births  1            # Outcome Date GA Lbr Len/2nd Weight Sex Delivery Anes PTL Lv  4 SAB           3 Para           2 Para           1 Para             Past medical history,surgical history, problem list, medications, allergies, family history and social history were all reviewed and documented in the EPIC chart.   Directed ROS with pertinent positives and negatives documented in the history of present illness/assessment and plan.  Exam:  Vitals:   09/12/19 0954  BP: 130/80   General appearance:  Normal  Abdomen: Normal  Gynecologic exam: Vulva normal.  Bimanual exam:  Uterus AV, mobile, normal volume, NT.  No adnexal mass, NT.  UPT neg   Assessment/Plan:  48 y.o. G4P0013   1. Secondary oligomenorrhea Probably perimenopausal or at least oligo ovulation.  Urine pregnancy test negative.  Will check FSH level.  Will take Provera 5 mg 1 tablet every day for 10 days if [facility] is low to bring her withdrawal bleeding.  We will continue with cyclic Provera every 3 months if not in menopause. - Pregnancy, urine - FSH  Other orders - medroxyPROGESTERone (PROVERA) 5 MG tablet; Take 1 tablet (5 mg total) by mouth daily for 10 days. Provera 1 tab PO daily x 10 days every 3 months.  Counseling on above issues and coordination of care more than 50% for 25 minutes.  Princess Bruins MD, 10:36 AM 09/12/2019

## 2019-09-13 LAB — FOLLICLE STIMULATING HORMONE: FSH: 9.1 m[IU]/mL

## 2019-09-20 ENCOUNTER — Encounter: Payer: Self-pay | Admitting: Obstetrics & Gynecology

## 2019-09-20 NOTE — Patient Instructions (Signed)
1. Secondary oligomenorrhea Probably perimenopausal or at least oligo ovulation.  Urine pregnancy test negative.  Will check FSH level.  Will take Provera 5 mg 1 tablet every day for 10 days if [facility] is low to bring her withdrawal bleeding.  We will continue with cyclic Provera every 3 months if not in menopause. - Pregnancy, urine - FSH  Other orders - medroxyPROGESTERone (PROVERA) 5 MG tablet; Take 1 tablet (5 mg total) by mouth daily for 10 days. Provera 1 tab PO daily x 10 days every 3 months.  Deanna Schultz, it was a pleasure seeing you today!  I will inform you of your results as soon as they are available.

## 2019-10-07 ENCOUNTER — Other Ambulatory Visit: Payer: Self-pay | Admitting: Family

## 2019-12-10 ENCOUNTER — Encounter: Payer: Self-pay | Admitting: Gastroenterology

## 2019-12-25 ENCOUNTER — Other Ambulatory Visit: Payer: Self-pay

## 2019-12-25 ENCOUNTER — Ambulatory Visit (AMBULATORY_SURGERY_CENTER): Payer: Self-pay | Admitting: *Deleted

## 2019-12-25 VITALS — Temp 98.4°F | Ht 63.0 in | Wt 164.6 lb

## 2019-12-25 DIAGNOSIS — Z8371 Family history of colonic polyps: Secondary | ICD-10-CM

## 2019-12-25 DIAGNOSIS — K581 Irritable bowel syndrome with constipation: Secondary | ICD-10-CM

## 2019-12-25 MED ORDER — SUPREP BOWEL PREP KIT 17.5-3.13-1.6 GM/177ML PO SOLN: ORAL | 0 refills | Status: DC

## 2019-12-25 NOTE — Progress Notes (Signed)
Pt is aware that care partner will wait in the car during procedure; if they feel like they will be too hot or cold to wait in the car; they may wait in the 4 th floor lobby. Patient is aware to bring only one care partner. We want them to wear a mask (we do not have any that we can provide them), practice social distancing, and we will check their temperatures when they get here.  I did remind the patient that their care partner needs to stay in the parking lot the entire time and have a cell phone available, we will call them when the pt is ready for discharge. Patient will wear mask into building.  No egg or soy allergy  No home oxygen use or problems with anesthesia  No medications for weight loss taken  emmi information given  $15 off Suprep coupon given  Pt told to stay on Linzess prior to procedure  Couldn't reach [facility] to schedule covid test prior to pt leaving [facility]- spoke with Morrie Sheldon at [facility] and covid test set up for 01-06-20 between 12:30 to 2:30 pm.  Spoke with pt and made her aware of this

## 2020-01-02 ENCOUNTER — Encounter: Payer: Self-pay | Admitting: Gastroenterology

## 2020-01-06 ENCOUNTER — Other Ambulatory Visit
Admission: RE | Admit: 2020-01-06 | Discharge: 2020-01-06 | Disposition: A | Discharge status: Home / Self Care | Payer: 59 | Source: Ambulatory Visit | Attending: Gastroenterology | Admitting: Gastroenterology

## 2020-01-06 ENCOUNTER — Other Ambulatory Visit: Payer: Self-pay

## 2020-01-06 DIAGNOSIS — Z01812 Encounter for preprocedural laboratory examination: Secondary | ICD-10-CM | POA: Insufficient documentation

## 2020-01-06 DIAGNOSIS — Z20822 Contact with and (suspected) exposure to covid-19: Secondary | ICD-10-CM | POA: Diagnosis not present

## 2020-01-07 ENCOUNTER — Other Ambulatory Visit: Payer: Self-pay | Admitting: Family

## 2020-01-07 DIAGNOSIS — F411 Generalized anxiety disorder: Secondary | ICD-10-CM

## 2020-01-07 LAB — SARS CORONAVIRUS 2 (TAT 6-24 HRS): SARS Coronavirus 2: NEGATIVE

## 2020-01-08 ENCOUNTER — Ambulatory Visit (AMBULATORY_SURGERY_CENTER): Payer: 59 | Admitting: Gastroenterology

## 2020-01-08 ENCOUNTER — Encounter: Payer: Self-pay | Admitting: Gastroenterology

## 2020-01-08 ENCOUNTER — Other Ambulatory Visit: Payer: Self-pay

## 2020-01-08 VITALS — BP 117/77 | HR 62 | Temp 96.2°F | Resp 20 | Ht 62.75 in | Wt 164.6 lb

## 2020-01-08 DIAGNOSIS — Z8371 Family history of colonic polyps: Secondary | ICD-10-CM

## 2020-01-08 DIAGNOSIS — K581 Irritable bowel syndrome with constipation: Secondary | ICD-10-CM

## 2020-01-08 DIAGNOSIS — Z1211 Encounter for screening for malignant neoplasm of colon: Secondary | ICD-10-CM

## 2020-01-08 MED ORDER — SODIUM CHLORIDE 0.9 % IV SOLN: 500.0000 mL | Freq: Once | INTRAVENOUS | Status: DC

## 2020-01-08 NOTE — Progress Notes (Signed)
Pt. Reports no change in her medical or surgical history since her pre-visit 12/25/2019.

## 2020-01-08 NOTE — Op Note (Signed)
[facility] Patient Name: Deanna Schultz Procedure Date: 01/08/2020 8:05 AM MRN: [id number] Endoscopist: Tressia Danas MD, MD Age: 49 Referring MD:  Date of Birth: 27-May-1971 Gender: Female Account #: [id number] Procedure:                Colonoscopy Indications:              Screening for colon cancer: Family history of colon                            polyps in distant relative(s) (mother and father) Medicines:                Monitored Anesthesia Care Procedure:                Pre-Anesthesia Assessment:                           - Prior to the procedure, a History and Physical                            was performed, and patient medications and                            allergies were reviewed. The patient's tolerance of                            previous anesthesia was also reviewed. The risks                            and benefits of the procedure and the sedation                            options and risks were discussed with the patient.                            All questions were answered, and informed consent                            was obtained. Prior Anticoagulants: The patient has                            taken no previous anticoagulant or antiplatelet                            agents. ASA Grade Assessment: II - A patient with                            mild systemic disease. After reviewing the risks                            and benefits, the patient was deemed in                            satisfactory condition to undergo the procedure.  After obtaining informed consent, the colonoscope                            was passed under direct vision. Throughout the                            procedure, the patient's blood pressure, pulse, and                            oxygen saturations were monitored continuously. The                            Colonoscope was introduced through the anus and   advanced to the the cecum, identified by                            appendiceal orifice and ileocecal valve. The                            colonoscopy was performed without difficulty. The                            patient tolerated the procedure well. The quality                            of the bowel preparation was good. The ileocecal                            valve, appendiceal orifice, and rectum were                            photographed. Scope In: 8:06:29 AM Scope Out: 8:16:38 AM Scope Withdrawal Time: 0 hours 7 minutes 43 seconds  Total Procedure Duration: 0 hours 10 minutes 9 seconds  Findings:                 The perianal and digital rectal examinations were                            normal.                           The colon (entire examined portion) appeared normal.                           The terminal ileum appeared normal.                           The exam was otherwise without abnormality on                            direct and retroflexion views except for small                            internal hemorrhoids. Complications:            No immediate  complications. Estimated Blood Loss:     Estimated blood loss: none. Impression:               - The entire examined colon is normal except for                            small internal hemorrhoids.                           - The examined portion of the ileum was normal.                           - The examination was otherwise normal on direct                            and retroflexion views.                           - No specimens collected. Recommendation:           - Patient has a contact number available for                            emergencies. The signs and symptoms of potential                            delayed complications were discussed with the                            patient. Return to normal activities tomorrow.                            Written discharge instructions were provided to the                             patient.                           - Resume previous diet.                           - Continue present medications.                           - Repeat colonoscopy in 5 years for screening                            purposes given the family history of polyps. Thornton Park MD, MD 01/08/2020 8:21:15 AM This report has been signed electronically.

## 2020-01-08 NOTE — Progress Notes (Signed)
PT taken to PACU. Monitors in place. VSS. Report given to RN. 

## 2020-01-08 NOTE — Patient Instructions (Signed)
YOU HAD AN ENDOSCOPIC PROCEDURE TODAY AT THE [facility]:   Refer to the procedure report that was given to you for any specific questions about what was found during the examination.  If the procedure report does not answer your questions, please call your gastroenterologist to clarify.  If you requested that your care partner not be given the details of your procedure findings, then the procedure report has been included in a sealed envelope for you to review at your convenience later.  YOU SHOULD EXPECT: Some feelings of bloating in the abdomen. Passage of more gas than usual.  Walking can help get rid of the air that was put into your GI tract during the procedure and reduce the bloating. If you had a lower endoscopy (such as a colonoscopy or flexible sigmoidoscopy) you may notice spotting of blood in your stool or on the toilet paper. If you underwent a bowel prep for your procedure, you may not have a normal bowel movement for a few days.  Please Note:  You might notice some irritation and congestion in your nose or some drainage.  This is from the oxygen used during your procedure.  There is no need for concern and it should clear up in a day or so.  SYMPTOMS TO REPORT IMMEDIATELY:   Following lower endoscopy (colonoscopy or flexible sigmoidoscopy):  Excessive amounts of blood in the stool  Significant tenderness or worsening of abdominal pains  Swelling of the abdomen that is new, acute  Fever of 100F or higher  For urgent or emergent issues, a gastroenterologist can be reached at any hour by calling [phone].   DIET:  We do recommend a small meal at first, but then you may proceed to your regular diet.  Drink plenty of fluids but you should avoid alcoholic beverages for 24 hours.  ACTIVITY:  You should plan to take it easy for the rest of today and you should NOT DRIVE or use heavy machinery until tomorrow (because of the sedation medicines used during the test).     FOLLOW UP: Our staff will call the number listed on your records 48-72 hours following your procedure to check on you and address any questions or concerns that you may have regarding the information given to you following your procedure. If we do not reach you, we will leave a message.  We will attempt to reach you two times.  During this call, we will ask if you have developed any symptoms of COVID 19. If you develop any symptoms (ie: fever, flu-like symptoms, shortness of breath, cough etc.) before then, please call [phone].  If you test positive for Covid 19 in the 2 weeks post procedure, please call and report this information to us.    If any biopsies were taken you will be contacted by phone or by letter within the next 1-3 weeks.  Please call us at [phone] if you have not heard about the biopsies in 3 weeks.    SIGNATURES/CONFIDENTIALITY: You and/or your care partner have signed paperwork which will be entered into your electronic medical record.  These signatures attest to the fact that that the information above on your After Visit Summary has been reviewed and is understood.  Full responsibility of the confidentiality of this discharge information lies with you and/or your care-partner. 

## 2020-01-10 ENCOUNTER — Telehealth: Payer: Self-pay | Admitting: *Deleted

## 2020-01-10 ENCOUNTER — Telehealth: Payer: Self-pay

## 2020-01-10 NOTE — Telephone Encounter (Signed)
  Follow up Call-  Call back number 01/08/2020  Post procedure Call Back phone  # [phone]  Permission to leave phone message Yes  Some recent data might be hidden     No answer

## 2020-01-10 NOTE — Telephone Encounter (Signed)
Follow up call made, no answer left message. 

## 2020-03-31 ENCOUNTER — Other Ambulatory Visit: Payer: Self-pay | Admitting: Family

## 2020-05-04 ENCOUNTER — Other Ambulatory Visit: Payer: Self-pay

## 2020-05-04 ENCOUNTER — Encounter: Payer: Self-pay | Admitting: Obstetrics & Gynecology

## 2020-05-05 ENCOUNTER — Encounter: Payer: Self-pay | Admitting: Obstetrics & Gynecology

## 2020-05-05 ENCOUNTER — Ambulatory Visit (INDEPENDENT_AMBULATORY_CARE_PROVIDER_SITE_OTHER): Payer: 59 | Admitting: Obstetrics & Gynecology

## 2020-05-05 VITALS — BP 134/90 | Ht 62.75 in | Wt 166.0 lb

## 2020-05-05 DIAGNOSIS — N914 Secondary oligomenorrhea: Secondary | ICD-10-CM | POA: Diagnosis not present

## 2020-05-05 DIAGNOSIS — E663 Overweight: Secondary | ICD-10-CM

## 2020-05-05 DIAGNOSIS — Z01419 Encounter for gynecological examination (general) (routine) without abnormal findings: Secondary | ICD-10-CM

## 2020-05-05 DIAGNOSIS — Z9189 Other specified personal risk factors, not elsewhere classified: Secondary | ICD-10-CM

## 2020-05-05 MED ORDER — MEDROXYPROGESTERONE ACETATE 5 MG PO TABS: 5.0000 mg | ORAL_TABLET | Freq: Every day | ORAL | 4 refills | Status: DC

## 2020-05-05 NOTE — Progress Notes (Signed)
Deanna Schultz [birth year] [id number]   History:    49 y.o.  [id number] Married.  Lost her mother 11/2018. Husband is a Engineer, structural.  Son 110 yo working, daughters 84 and 39 yo.  RP:  Established patient presenting for annual gyn exam   HPI: Menses normal monthly from 08/2019 to 12/2019.  LMP 01/21/2020.  No menses since then and didn't take the cyclic Provera.  No pelvic pain.  No pain with intercourse.  Vasectomy.  Urine and bowel movements normal.  Breast normal.  BMI improved to 29.64. Walking daily.  Decreasing carbs.  Health labs with family NP.  [place] 12/2019.   Past medical history,surgical history, family history and social history were all reviewed and documented in the EPIC chart.  Gynecologic History Patient's last menstrual period was 01/21/2020.  Obstetric History OB History  Gravida Para Term Preterm AB Living  4 3     1 3   SAB TAB Ectopic Multiple Live Births  1            # Outcome Date GA Lbr Len/2nd Weight Sex Delivery Anes PTL Lv  4 SAB           3 Para           2 Para           1 Para              ROS: A ROS was performed and pertinent positives and negatives are included in the history.  GENERAL: No fevers or chills. HEENT: No change in vision, no earache, sore throat or sinus congestion. NECK: No pain or stiffness. CARDIOVASCULAR: No chest pain or pressure. No palpitations. PULMONARY: No shortness of breath, cough or wheeze. GASTROINTESTINAL: No abdominal pain, nausea, vomiting or diarrhea, melena or bright red blood per rectum. GENITOURINARY: No urinary frequency, urgency, hesitancy or dysuria. MUSCULOSKELETAL: No joint or muscle pain, no back pain, no recent trauma. DERMATOLOGIC: No rash, no itching, no lesions. ENDOCRINE: No polyuria, polydipsia, no heat or cold intolerance. No recent change in weight. HEMATOLOGICAL: No anemia or easy bruising or bleeding. NEUROLOGIC: No headache, seizures, numbness, tingling or weakness. PSYCHIATRIC: No depression, no  loss of interest in normal activity or change in sleep pattern.     Exam:   BP 134/90   Ht 5' 2.75" (1.594 m)   Wt 166 lb (75.3 kg)   LMP 01/21/2020   BMI 29.64 kg/m   Body mass index is 29.64 kg/m.  General appearance : Well developed well nourished female. No acute distress HEENT: Eyes: no retinal hemorrhage or exudates,  Neck supple, trachea midline, no carotid bruits, no thyroidmegaly Lungs: Clear to auscultation, no rhonchi or wheezes, or rib retractions  Heart: Regular rate and rhythm, no murmurs or gallops Breast:Examined in sitting and supine position were symmetrical in appearance, no palpable masses or tenderness,  no skin retraction, no nipple inversion, no nipple discharge, no skin discoloration, no axillary or supraclavicular lymphadenopathy Abdomen: no palpable masses or tenderness, no rebound or guarding Extremities: no edema or skin discoloration or tenderness  Pelvic: Vulva: Normal             Vagina: No gross lesions or discharge  Cervix: No gross lesions or discharge  Uterus  AV, normal size, shape and consistency, non-tender and mobile  Adnexa  Without masses or tenderness  Anus: Normal   Assessment/Plan:  49 y.o. female for annual exam   1. Well female exam with routine gynecological exam Normal  gynecologic exam.  No indication to repeat a Pap test this year.  Breast exam normal.  Will schedule a screening mammogram now.  Colonoscopy 2021.  Health labs with family physician.  2. Relies on partner vasectomy for contraception  3. Secondary oligomenorrhea Probably in perimenopause.  Continue with cyclic Provera every 3 months if no spontaneous menses.  4. Overweight (BMI 25.0-29.9) Losing weight.  Continue with a low calorie/carb diet and aerobic activities 5 times a week with light weightlifting every 2 days.  Genia Del MD, 10:13 AM 05/05/2020

## 2020-05-11 ENCOUNTER — Encounter: Payer: Self-pay | Admitting: Obstetrics & Gynecology

## 2020-05-11 NOTE — Patient Instructions (Signed)
1. Well female exam with routine gynecological exam Normal gynecologic exam.  No indication to repeat a Pap test this year.  Breast exam normal.  Will schedule a screening mammogram now.  Colonoscopy 2021.  Health labs with family physician.  2. Relies on partner vasectomy for contraception  3. Secondary oligomenorrhea Probably in perimenopause.  Continue with cyclic Provera every 3 months if no spontaneous menses.  4. Overweight (BMI 25.0-29.9) Losing weight.  Continue with a low calorie/carb diet and aerobic activities 5 times a week with light weightlifting every 2 days.  Deanna Schultz, it was a pleasure seeing you today!

## 2020-06-23 ENCOUNTER — Encounter: Payer: Self-pay | Admitting: Family

## 2020-06-23 ENCOUNTER — Ambulatory Visit (INDEPENDENT_AMBULATORY_CARE_PROVIDER_SITE_OTHER): Payer: 59 | Admitting: Family

## 2020-06-23 ENCOUNTER — Other Ambulatory Visit: Payer: Self-pay

## 2020-06-23 VITALS — BP 118/80 | HR 68 | Temp 98.1°F | Resp 15 | Ht 62.75 in | Wt 164.2 lb

## 2020-06-23 DIAGNOSIS — Z0000 Encounter for general adult medical examination without abnormal findings: Secondary | ICD-10-CM | POA: Diagnosis not present

## 2020-06-23 DIAGNOSIS — I10 Essential (primary) hypertension: Secondary | ICD-10-CM | POA: Diagnosis not present

## 2020-06-23 LAB — CBC WITH DIFFERENTIAL/PLATELET
Basophils Absolute: 0 10*3/uL (ref 0.0–0.1)
Basophils Relative: 0.3 % (ref 0.0–3.0)
Eosinophils Absolute: 0 10*3/uL (ref 0.0–0.7)
Eosinophils Relative: 0.9 % (ref 0.0–5.0)
HCT: 41.5 % (ref 36.0–46.0)
Hemoglobin: 14 g/dL (ref 12.0–15.0)
Lymphocytes Relative: 29.8 % (ref 12.0–46.0)
Lymphs Abs: 1.6 10*3/uL (ref 0.7–4.0)
MCHC: 33.8 g/dL (ref 30.0–36.0)
MCV: 96 fl (ref 78.0–100.0)
Monocytes Absolute: 0.5 10*3/uL (ref 0.1–1.0)
Monocytes Relative: 9.4 % (ref 3.0–12.0)
Neutro Abs: 3.2 10*3/uL (ref 1.4–7.7)
Neutrophils Relative %: 59.6 % (ref 43.0–77.0)
Platelets: 195 10*3/uL (ref 150.0–400.0)
RBC: 4.32 Mil/uL (ref 3.87–5.11)
RDW: 13.6 % (ref 11.5–15.5)
WBC: 5.4 10*3/uL (ref 4.0–10.5)

## 2020-06-23 LAB — LIPID PANEL
Cholesterol: 238 mg/dL — ABNORMAL HIGH (ref 0–200)
HDL: 71.7 mg/dL (ref >39.00)
LDL Cholesterol: 147 mg/dL — ABNORMAL HIGH (ref 0–99)
NonHDL: 166.35
Total CHOL/HDL Ratio: 3
Triglycerides: 98 mg/dL (ref 0.0–149.0)
VLDL: 19.6 mg/dL (ref 0.0–40.0)

## 2020-06-23 LAB — COMPREHENSIVE METABOLIC PANEL
ALT: 19 U/L (ref 0–35)
AST: 19 U/L (ref 0–37)
Albumin: 4.6 g/dL (ref 3.5–5.2)
Alkaline Phosphatase: 49 U/L (ref 39–117)
BUN: 14 mg/dL (ref 6–23)
CO2: 26 mEq/L (ref 19–32)
Calcium: 9.7 mg/dL (ref 8.4–10.5)
Chloride: 101 mEq/L (ref 96–112)
Creatinine, Ser: 0.63 mg/dL (ref 0.40–1.20)
GFR: 100.13 mL/min (ref >60.00)
Glucose, Bld: 85 mg/dL (ref 70–99)
Potassium: 3.7 mEq/L (ref 3.5–5.1)
Sodium: 135 mEq/L (ref 135–145)
Total Bilirubin: 0.4 mg/dL (ref 0.2–1.2)
Total Protein: 7.6 g/dL (ref 6.0–8.3)

## 2020-06-23 LAB — TSH: TSH: 0.96 u[IU]/mL (ref 0.35–4.50)

## 2020-06-23 LAB — VITAMIN D 25 HYDROXY (VIT D DEFICIENCY, FRACTURES): VITD: 54.42 ng/mL (ref 30.00–100.00)

## 2020-06-23 LAB — HEMOGLOBIN A1C: Hgb A1c MFr Bld: 5.5 % (ref 4.6–6.5)

## 2020-06-23 NOTE — Assessment & Plan Note (Signed)
Stable, continue amlodipine.  

## 2020-06-23 NOTE — Assessment & Plan Note (Signed)
Patient declines breast exam or Pap smear as she follows with GYN.  Mammogram is up-to-date.  congratulated patient on weight loss

## 2020-06-23 NOTE — Patient Instructions (Signed)
Nice to see you! ° ° °Health Maintenance, Female °Adopting a healthy lifestyle and getting preventive care are important in promoting health and wellness. Ask your health care provider about: °· The right schedule for you to have regular tests and exams. °· Things you can do on your own to prevent diseases and keep yourself healthy. °What should I know about diet, weight, and exercise? °Eat a healthy diet ° °· Eat a diet that includes plenty of vegetables, fruits, low-fat dairy products, and lean protein. °· Do not eat a lot of foods that are high in solid fats, added sugars, or sodium. °Maintain a healthy weight °Body mass index (BMI) is used to identify weight problems. It estimates body fat based on height and weight. Your health care provider can help determine your BMI and help you achieve or maintain a healthy weight. °Get regular exercise °Get regular exercise. This is one of the most important things you can do for your health. Most adults should: °· Exercise for at least 150 minutes each week. The exercise should increase your heart rate and make you sweat (moderate-intensity exercise). °· Do strengthening exercises at least twice a week. This is in addition to the moderate-intensity exercise. °· Spend less time sitting. Even light physical activity can be beneficial. °Watch cholesterol and blood lipids °Have your blood tested for lipids and cholesterol at 50 years of age, then have this test every 5 years. °Have your cholesterol levels checked more often if: °· Your lipid or cholesterol levels are high. °· You are older than 50 years of age. °· You are at high risk for heart disease. °What should I know about cancer screening? °Depending on your health history and family history, you may need to have cancer screening at various ages. This may include screening for: °· Breast cancer. °· Cervical cancer. °· Colorectal cancer. °· Skin cancer. °· Lung cancer. °What should I know about heart disease, diabetes,  and high blood pressure? °Blood pressure and heart disease °· High blood pressure causes heart disease and increases the risk of stroke. This is more likely to develop in people who have high blood pressure readings, are of African descent, or are overweight. °· Have your blood pressure checked: °? Every 3-5 years if you are 18-39 years of age. °? Every year if you are 40 years old or older. °Diabetes °Have regular diabetes screenings. This checks your fasting blood sugar level. Have the screening done: °· Once every three years after age 40 if you are at a normal weight and have a low risk for diabetes. °· More often and at a younger age if you are overweight or have a high risk for diabetes. °What should I know about preventing infection? °Hepatitis B °If you have a higher risk for hepatitis B, you should be screened for this virus. Talk with your health care provider to find out if you are at risk for hepatitis B infection. °Hepatitis C °Testing is recommended for: °· Everyone born from 1945 through 1965. °· Anyone with known risk factors for hepatitis C. °Sexually transmitted infections (STIs) °· Get screened for STIs, including gonorrhea and chlamydia, if: °? You are sexually active and are younger than 50 years of age. °? You are older than 50 years of age and your health care provider tells you that you are at risk for this type of infection. °? Your sexual activity has changed since you were last screened, and you are at increased risk for chlamydia or gonorrhea.   Ask your health care provider if you are at risk. °· Ask your health care provider about whether you are at high risk for HIV. Your health care provider may recommend a prescription medicine to help prevent HIV infection. If you choose to take medicine to prevent HIV, you should first get tested for HIV. You should then be tested every 3 months for as long as you are taking the medicine. °Pregnancy °· If you are about to stop having your period  (premenopausal) and you may become pregnant, seek counseling before you get pregnant. °· Take 400 to 800 micrograms (mcg) of folic acid every day if you become pregnant. °· Ask for birth control (contraception) if you want to prevent pregnancy. °Osteoporosis and menopause °Osteoporosis is a disease in which the bones lose minerals and strength with aging. This can result in bone fractures. If you are 65 years old or older, or if you are at risk for osteoporosis and fractures, ask your health care provider if you should: °· Be screened for bone loss. °· Take a calcium or vitamin D supplement to lower your risk of fractures. °· Be given hormone replacement therapy (HRT) to treat symptoms of menopause. °Follow these instructions at home: °Lifestyle °· Do not use any products that contain nicotine or tobacco, such as cigarettes, e-cigarettes, and chewing tobacco. If you need help quitting, ask your health care provider. °· Do not use street drugs. °· Do not share needles. °· Ask your health care provider for help if you need support or information about quitting drugs. °Alcohol use °· Do not drink alcohol if: °? Your health care provider tells you not to drink. °? You are pregnant, may be pregnant, or are planning to become pregnant. °· If you drink alcohol: °? Limit how much you use to 0-1 drink a day. °? Limit intake if you are breastfeeding. °· Be aware of how much alcohol is in your drink. In the [place] one drink equals one 12 oz bottle of beer (355 mL), one 5 oz glass of wine (148 mL), or one 1½ oz glass of hard liquor (44 mL). °General instructions °· Schedule regular health, dental, and eye exams. °· Stay current with your vaccines. °· Tell your health care provider if: °? You often feel depressed. °? You have ever been abused or do not feel safe at home. °Summary °· Adopting a healthy lifestyle and getting preventive care are important in promoting health and wellness. °· Follow your health care provider's  instructions about healthy diet, exercising, and getting tested or screened for diseases. °· Follow your health care provider's instructions on monitoring your cholesterol and blood pressure. °This information is not intended to replace advice given to you by your health care provider. Make sure you discuss any questions you have with your health care provider. °Document Revised: 11/07/2018 Document Reviewed: 11/07/2018 °Elsevier Patient Education © 2020 [employer] ° °

## 2020-06-23 NOTE — Progress Notes (Signed)
Subjective:    Patient ID: Deanna Schultz, female    DOB: [birth year], 49 y.o.   MRN: [id number]  CC: Deanna Schultz is a 49 y.o. female who presents today for physical exam.    HPI:   Feels well , no complaints   HTN- compliant with norvasc. No Cp, sob.   Has lost 20lbs with better eating choices. Walking for exercise. Pleased with weight loss.    Colorectal Cancer Screening: UTD Dr Tarri Glenn, repeat in 5 years. Breast Cancer Screening: Mammogram UTD Cervical Cancer Screening: UTD. Follows with Dr Kristen [facility] screening/DEXA for 65+: No increased fracture risk. Defer screening at this time. Lung Cancer Screening: Doesn't have 30 year pack year history and age > 48 years.       Tetanus - UTD Hepatitis C screening - Candidate for, consents  Labs: Screening labs today. Exercise: Gets regular exercise.  Alcohol use: 5 drinks per week Smoking/tobacco use: Nonsmoker.  Regular dental exams: UTD, Wears seat belt: Yes. Skin: follows annually with dermatology  HISTORY:  Past Medical History:  Diagnosis Date  . Anxiety   . Depression   . GERD (gastroesophageal reflux disease)   . Hypertension   . IBS (irritable bowel syndrome)   . Placenta previa     Past Surgical History:  Procedure Laterality Date  . CESAREAN SECTION     x 1   . DILATION AND CURETTAGE OF UTERUS  06/2002   due to miscarriage  . VAGINAL DELIVERY     x 2   Family History  Problem Relation Age of Onset  . Hyperlipidemia Mother   . Hypertension Mother   . Diabetes Mother   . Colon polyps Mother   . Heart disease Mother   . Hypertension Father   . Heart disease Father   . Hyperlipidemia Father   . Arthritis Father   . Colon polyps Father   . Cancer Brother 25       Brain Tumor, GBM  . Hypertension Brother   . Heart disease Maternal Grandfather   . Colon cancer Neg Hx   . Esophageal cancer Neg Hx   . Stomach cancer Neg Hx   . Rectal cancer Neg Hx       ALLERGIES: Patient has no  known allergies.  Current Outpatient Medications on File Prior to Visit  Medication Sig Dispense Refill  . amLODipine (NORVASC) 5 MG tablet TAKE 1 TABLET BY MOUTH EVERY DAY 30 tablet 6  . CALCIUM PO Take 1,000 mg by mouth daily.    . Cholecalciferol (VITAMIN D3 PO) Take by mouth.    . hydrochlorothiazide (MICROZIDE) 12.5 MG capsule TAKE 1 CAPSULE BY MOUTH EVERY DAY 30 capsule 6  . LINZESS 145 MCG CAPS capsule TAKE 1 CAPSULE BY MOUTH  DAILY 90 capsule 3  . meclizine (ANTIVERT) 12.5 MG tablet Take 1 tablet (12.5 mg total) by mouth 3 (three) times daily as needed for dizziness. 30 tablet 0  . Multiple Vitamins-Minerals (HAIR SKIN AND NAILS FORMULA PO) Take by mouth daily.    Earney Navy Bicarbonate (ZEGERID) 20-1100 MG CAPS Take 1 capsule by mouth daily before breakfast. 28 each 3  . sertraline (ZOLOFT) 100 MG tablet TAKE 1 TABLET BY MOUTH EVERYDAY AT BEDTIME 30 tablet 5  . medroxyPROGESTERone (PROVERA) 5 MG tablet Take 1 tablet (5 mg total) by mouth daily for 10 days. Cyclic Provera every 3 months if no spontaneous menstrual period. 10 tablet 4   No current facility-administered medications on  file prior to visit.    Social History   Tobacco Use  . Smoking status: Never Smoker  . Smokeless tobacco: Never Used  Vaping Use  . Vaping Use: Never used  Substance Use Topics  . Alcohol use: Yes    Alcohol/week: 5.0 standard drinks    Types: 5 Standard drinks or equivalent per week    Comment: 5 DRINKS A WEEK  . Drug use: No    Review of Systems  Constitutional: Negative for chills, fever and unexpected weight change.  HENT: Negative for congestion.   Respiratory: Negative for cough.   Cardiovascular: Negative for chest pain, palpitations and leg swelling.  Gastrointestinal: Negative for nausea and vomiting.  Musculoskeletal: Negative for arthralgias and myalgias.  Skin: Negative for rash.  Neurological: Negative for headaches.  Hematological: Negative for adenopathy.    Psychiatric/Behavioral: Negative for confusion.      Objective:    BP 118/80 (BP Location: Left Arm, Patient Position: Sitting, Cuff Size: Normal)   Pulse 68   Temp 98.1 F (36.7 C) (Oral)   Resp 15   Ht 5' 2.75" (1.594 m)   Wt 164 lb 3.2 oz (74.5 kg)   SpO2 97%   BMI 29.32 kg/m   BP Readings from Last 3 Encounters:  06/23/20 118/80  05/05/20 134/90  01/08/20 117/77   Wt Readings from Last 3 Encounters:  06/23/20 164 lb 3.2 oz (74.5 kg)  05/05/20 166 lb (75.3 kg)  01/08/20 164 lb 9.6 oz (74.7 kg)    Physical Exam Vitals reviewed.  Constitutional:      Appearance: She is well-developed.  Eyes:     Conjunctiva/sclera: Conjunctivae normal.  Neck:     Thyroid: No thyroid mass or thyromegaly.  Cardiovascular:     Rate and Rhythm: Normal rate and regular rhythm.     Pulses: Normal pulses.     Heart sounds: Normal heart sounds.  Pulmonary:     Effort: Pulmonary effort is normal.     Breath sounds: Normal breath sounds. No wheezing, rhonchi or rales.  Lymphadenopathy:     Head:     Right side of head: No submental, submandibular, tonsillar, preauricular, posterior auricular or occipital adenopathy.     Left side of head: No submental, submandibular, tonsillar, preauricular, posterior auricular or occipital adenopathy.     Cervical: No cervical adenopathy.  Skin:    General: Skin is warm and dry.  Neurological:     Mental Status: She is alert.  Psychiatric:        Speech: Speech normal.        Behavior: Behavior normal.        Thought Content: Thought content normal.        Assessment & Plan:   Problem List Items Addressed This Visit      Cardiovascular and Mediastinum   Essential hypertension, benign (Chronic)    Stable, continue amlodipine        Other   Routine general medical examination at a health care facility - Primary    Patient declines breast exam or Pap smear as she follows with GYN.  Mammogram is up-to-date.  congratulated patient on weight  loss      Relevant Orders   TSH   CBC with Differential/Platelet   Comprehensive metabolic panel   Hemoglobin A1c   Hepatitis C antibody   Lipid panel   VITAMIN D 25 Hydroxy (Vit-D Deficiency, Fractures)       I am having Minh L. Wisniewski maintain her Omeprazole-Sodium  Bicarbonate, meclizine, Cholecalciferol (VITAMIN D3 PO), Linzess, CALCIUM PO, Multiple Vitamins-Minerals (HAIR SKIN AND NAILS FORMULA PO), sertraline, hydrochlorothiazide, amLODipine, and medroxyPROGESTERone.   No orders of the defined types were placed in this encounter.   Return precautions given.   Risks, benefits, and alternatives of the medications and treatment plan prescribed today were discussed, and patient expressed understanding.   Education regarding symptom management and diagnosis given to patient on AVS.   Continue to follow with Burnard Hawthorne, FNP for routine health maintenance.   Deanna Schultz and I agreed with plan.   Mable Paris, FNP

## 2020-06-24 LAB — HEPATITIS C ANTIBODY
Hepatitis C Ab: NONREACTIVE
SIGNAL TO CUT-OFF: 0.01 (ref <1.00)

## 2020-06-28 ENCOUNTER — Other Ambulatory Visit: Payer: Self-pay | Admitting: Family

## 2020-06-28 DIAGNOSIS — F411 Generalized anxiety disorder: Secondary | ICD-10-CM

## 2020-08-30 ENCOUNTER — Other Ambulatory Visit: Payer: Self-pay | Admitting: Family

## 2020-11-04 ENCOUNTER — Other Ambulatory Visit: Payer: Self-pay | Admitting: Family

## 2020-12-31 ENCOUNTER — Other Ambulatory Visit: Payer: Self-pay | Admitting: Family

## 2020-12-31 DIAGNOSIS — F411 Generalized anxiety disorder: Secondary | ICD-10-CM

## 2021-03-29 ENCOUNTER — Other Ambulatory Visit: Payer: Self-pay | Admitting: Family

## 2021-03-29 DIAGNOSIS — F411 Generalized anxiety disorder: Secondary | ICD-10-CM

## 2021-05-24 ENCOUNTER — Other Ambulatory Visit (HOSPITAL_COMMUNITY)
Admission: RE | Admit: 2021-05-24 | Discharge: 2021-05-24 | Disposition: A | Discharge status: Home / Self Care | Payer: Self-pay | Source: Ambulatory Visit | Attending: Obstetrics & Gynecology | Admitting: Obstetrics & Gynecology

## 2021-05-24 ENCOUNTER — Ambulatory Visit (INDEPENDENT_AMBULATORY_CARE_PROVIDER_SITE_OTHER): Payer: Managed Care, Other (non HMO) | Admitting: Obstetrics & Gynecology

## 2021-05-24 ENCOUNTER — Other Ambulatory Visit: Payer: Self-pay

## 2021-05-24 ENCOUNTER — Encounter: Payer: Self-pay | Admitting: Obstetrics & Gynecology

## 2021-05-24 VITALS — BP 112/78 | HR 78 | Resp 16 | Ht 62.5 in | Wt 170.0 lb

## 2021-05-24 DIAGNOSIS — E6609 Other obesity due to excess calories: Secondary | ICD-10-CM

## 2021-05-24 DIAGNOSIS — Z6830 Body mass index (BMI) 30.0-30.9, adult: Secondary | ICD-10-CM

## 2021-05-24 DIAGNOSIS — N914 Secondary oligomenorrhea: Secondary | ICD-10-CM | POA: Diagnosis not present

## 2021-05-24 DIAGNOSIS — Z01419 Encounter for gynecological examination (general) (routine) without abnormal findings: Secondary | ICD-10-CM

## 2021-05-24 DIAGNOSIS — Z9189 Other specified personal risk factors, not elsewhere classified: Secondary | ICD-10-CM

## 2021-05-24 MED ORDER — MEDROXYPROGESTERONE ACETATE 5 MG PO TABS: 5.0000 mg | ORAL_TABLET | Freq: Every day | ORAL | 4 refills | Status: DC

## 2021-05-24 NOTE — Progress Notes (Signed)
Deanna Schultz 1971/03/08 [id number]   History:    50 y.o.   [id number] Married.  Lost her mother 11/2018. Husband is a Emergency planning/management officer.  Son 64 yo working, daughters 14 and 87 yo.   RP:  Established patient presenting for annual gyn exam   HPI:  Oligomenorrhea. Took Provera x 10 days in 07/2020 with no withdrawal bleeding.  Had a menstrual period 01/2021.  No pelvic pain.  No pain with intercourse.  Vasectomy.  Urine and bowel movements normal.  Breast normal.  BMI 30.6.  Walking daily.  Decreasing carbs.  Health labs with family NP.  [place] 12/2019.   Past medical history,surgical history, family history and social history were all reviewed and documented in the EPIC chart.  Gynecologic History Patient's last menstrual period was 02/03/2021.  Obstetric History OB History  Gravida Para Term Preterm AB Living  4 3     1 3   SAB IAB Ectopic Multiple Live Births  1            # Outcome Date GA Lbr Len/2nd Weight Sex Delivery Anes PTL Lv  4 SAB           3 Para           2 Para           1 Para              ROS: A ROS was performed and pertinent positives and negatives are included in the history.  GENERAL: No fevers or chills. HEENT: No change in vision, no earache, sore throat or sinus congestion. NECK: No pain or stiffness. CARDIOVASCULAR: No chest pain or pressure. No palpitations. PULMONARY: No shortness of breath, cough or wheeze. GASTROINTESTINAL: No abdominal pain, nausea, vomiting or diarrhea, melena or bright red blood per rectum. GENITOURINARY: No urinary frequency, urgency, hesitancy or dysuria. MUSCULOSKELETAL: No joint or muscle pain, no back pain, no recent trauma. DERMATOLOGIC: No rash, no itching, no lesions. ENDOCRINE: No polyuria, polydipsia, no heat or cold intolerance. No recent change in weight. HEMATOLOGICAL: No anemia or easy bruising or bleeding. NEUROLOGIC: No headache, seizures, numbness, tingling or weakness. PSYCHIATRIC: No depression, no loss of interest in  normal activity or change in sleep pattern.     Exam:   BP 112/78   Pulse 78   Resp 16   Ht 5' 2.5" (1.588 m)   Wt 170 lb (77.1 kg)   LMP 02/03/2021   BMI 30.60 kg/m   Body mass index is 30.6 kg/m.  General appearance : Well developed well nourished female. No acute distress HEENT: Eyes: no retinal hemorrhage or exudates,  Neck supple, trachea midline, no carotid bruits, no thyroidmegaly Lungs: Clear to auscultation, no rhonchi or wheezes, or rib retractions  Heart: Regular rate and rhythm, no murmurs or gallops Breast:Examined in sitting and supine position were symmetrical in appearance, no palpable masses or tenderness,  no skin retraction, no nipple inversion, no nipple discharge, no skin discoloration, no axillary or supraclavicular lymphadenopathy Abdomen: no palpable masses or tenderness, no rebound or guarding Extremities: no edema or skin discoloration or tenderness  Pelvic: Vulva: Normal             Vagina: No gross lesions or discharge  Cervix: No gross lesions or discharge  Uterus  AV, normal size, shape and consistency, non-tender and mobile  Adnexa  Without masses or tenderness  Anus: Normal   Assessment/Plan:  50 y.o. female for annual exam   1. Encounter  for routine gynecological examination with Papanicolaou smear of cervix Normal gynecologic exam.  Pap reflex done today.  Breast exam normal.  Screening mammogram was negative in June 2022.  Colonoscopy in 2021.  Health labs with family nurse practitioner. - Cytology - PAP( [place])  2. Relies on partner vasectomy for contraception  3. Secondary oligomenorrhea Probably perimenopausal.  We will continue to use cyclic Provera if no spontaneous menses at 3 months.  Prescription sent to pharmacy.  4. Class 1 obesity due to excess calories without serious comorbidity with body mass index (BMI) of 30.0 to 30.9 in adult Recommend a lower calorie/carb diet.  Intermittent fasting discussed.  Aerobic activities  5 times a week and light weightlifting every 2 days.  Other orders - Cyanocobalamin (B-12 PO); Take by mouth. - MAGNESIUM PO; Take by mouth. - medroxyPROGESTERone (PROVERA) 5 MG tablet; Take 1 tablet (5 mg total) by mouth daily for 10 days. Cyclic Provera every 3 months if no spontaneous menstrual period.    Genia Del MD, 12:28 PM 05/24/2021

## 2021-05-25 LAB — CYTOLOGY - PAP: Diagnosis: NEGATIVE

## 2021-06-01 ENCOUNTER — Other Ambulatory Visit: Payer: Self-pay | Admitting: Family

## 2021-06-21 ENCOUNTER — Ambulatory Visit: Payer: Managed Care, Other (non HMO) | Admitting: Family

## 2021-06-21 ENCOUNTER — Encounter: Payer: Self-pay | Admitting: Family

## 2021-06-21 ENCOUNTER — Other Ambulatory Visit: Payer: Self-pay

## 2021-06-21 VITALS — BP 118/80 | HR 79 | Temp 98.2°F | Ht 62.5 in | Wt 172.2 lb

## 2021-06-21 DIAGNOSIS — E669 Obesity, unspecified: Secondary | ICD-10-CM | POA: Diagnosis not present

## 2021-06-21 DIAGNOSIS — F411 Generalized anxiety disorder: Secondary | ICD-10-CM

## 2021-06-21 DIAGNOSIS — I10 Essential (primary) hypertension: Secondary | ICD-10-CM

## 2021-06-21 LAB — COMPREHENSIVE METABOLIC PANEL
ALT: 23 U/L (ref 0–35)
AST: 22 U/L (ref 0–37)
Albumin: 4.5 g/dL (ref 3.5–5.2)
Alkaline Phosphatase: 51 U/L (ref 39–117)
BUN: 11 mg/dL (ref 6–23)
CO2: 28 mEq/L (ref 19–32)
Calcium: 9.4 mg/dL (ref 8.4–10.5)
Chloride: 101 mEq/L (ref 96–112)
Creatinine, Ser: 0.58 mg/dL (ref 0.40–1.20)
GFR: 105.27 mL/min (ref >60.00)
Glucose, Bld: 90 mg/dL (ref 70–99)
Potassium: 3.7 mEq/L (ref 3.5–5.1)
Sodium: 138 mEq/L (ref 135–145)
Total Bilirubin: 0.3 mg/dL (ref 0.2–1.2)
Total Protein: 7.3 g/dL (ref 6.0–8.3)

## 2021-06-21 LAB — LIPID PANEL
Cholesterol: 248 mg/dL — ABNORMAL HIGH (ref 0–200)
HDL: 69.7 mg/dL (ref >39.00)
LDL Cholesterol: 152 mg/dL — ABNORMAL HIGH (ref 0–99)
NonHDL: 178.12
Total CHOL/HDL Ratio: 4
Triglycerides: 129 mg/dL (ref 0.0–149.0)
VLDL: 25.8 mg/dL (ref 0.0–40.0)

## 2021-06-21 LAB — CBC WITH DIFFERENTIAL/PLATELET
Basophils Absolute: 0 10*3/uL (ref 0.0–0.1)
Basophils Relative: 0.3 % (ref 0.0–3.0)
Eosinophils Absolute: 0.1 10*3/uL (ref 0.0–0.7)
Eosinophils Relative: 1.2 % (ref 0.0–5.0)
HCT: 40.4 % (ref 36.0–46.0)
Hemoglobin: 13.5 g/dL (ref 12.0–15.0)
Lymphocytes Relative: 23.9 % (ref 12.0–46.0)
Lymphs Abs: 1.7 10*3/uL (ref 0.7–4.0)
MCHC: 33.3 g/dL (ref 30.0–36.0)
MCV: 95.7 fl (ref 78.0–100.0)
Monocytes Absolute: 0.7 10*3/uL (ref 0.1–1.0)
Monocytes Relative: 9.4 % (ref 3.0–12.0)
Neutro Abs: 4.6 10*3/uL (ref 1.4–7.7)
Neutrophils Relative %: 65.2 % (ref 43.0–77.0)
Platelets: 231 10*3/uL (ref 150.0–400.0)
RBC: 4.23 Mil/uL (ref 3.87–5.11)
RDW: 13.4 % (ref 11.5–15.5)
WBC: 7.1 10*3/uL (ref 4.0–10.5)

## 2021-06-21 LAB — TSH: TSH: 0.81 u[IU]/mL (ref 0.35–5.50)

## 2021-06-21 LAB — VITAMIN D 25 HYDROXY (VIT D DEFICIENCY, FRACTURES): VITD: 51.82 ng/mL (ref 30.00–100.00)

## 2021-06-21 LAB — HEMOGLOBIN A1C: Hgb A1c MFr Bld: 5.6 % (ref 4.6–6.5)

## 2021-06-21 MED ORDER — LINACLOTIDE 145 MCG PO CAPS: 145.0000 ug | ORAL_CAPSULE | Freq: Every day | ORAL | 3 refills | Status: DC

## 2021-06-21 MED ORDER — HYDROXYZINE HCL 10 MG PO TABS: 10.0000 mg | ORAL_TABLET | Freq: Two times a day (BID) | ORAL | 0 refills | Status: DC | PRN

## 2021-06-21 MED ORDER — WEGOVY 0.25 MG/0.5ML ~~LOC~~ SOAJ: 0.2500 mg | SUBCUTANEOUS | 2 refills | Status: DC

## 2021-06-21 NOTE — Assessment & Plan Note (Addendum)
Chronic, uncontrolled.  Discussed various options today including decreasing Zoloft to see if this would help with her weight loss; we also discussed increasing Zoloft due to breakthrough anxiety.  We ultimately decided to continue Zoloft 100 mg and start as needed hydroxyzine for breakthrough anxiety which is affecting sleep.  Close follow-up to discuss role of counseling, perhaps adjunct in with Wellbutrin if needed or changing to another SSRI altogether.

## 2021-06-21 NOTE — Assessment & Plan Note (Signed)
Stable, controlled.  Continue amlodipine 5 mg, hydrochlorothiazide 12.5

## 2021-06-21 NOTE — Patient Instructions (Signed)
As discussed, we will continue Zoloft 100 mg for now.  I have sent in Atarax to be used as needed for anxiety.  Remember this medication is sedating.  Please do not drive on this medication  Trial of wegovy  We have discussed starting non insulin daily injectable medication called Wegovy  which is a glucagon like peptide (GLP 1) agonist and works by delaying gastric emptying and increasing insulin secretion.It is given once per week. Most patients see significant weight loss with this drug class.   You may NOT take either medication if you or your family has history of thyroid, parathyroid, OR adrenal cancer. Please confirm you and your family does NOT have this history as this drug class has black box warning on this medication for that reason.   Advise to follow with directions on prescription and slowly increase from 0.25mg  [place] once per week ;stay here for 4 weeks. You may then increase to 0.5mg  [place] once per week and stay there for 4 weeks.  We can slowly titrate further at follow up with goal of no more than 1-2 lbs weight loss per week.

## 2021-06-21 NOTE — Progress Notes (Signed)
Subjective:    Patient ID: Deanna Schultz, female    DOB: December 01, 1970, 50 y.o.   MRN: [id number]  CC: Deanna Schultz is a 50 y.o. female who presents today for follow up.   HPI: Frustrated by weight and difficulty in loosing weight. Walking daily. She questions whether or not Zoloft is contributory to her ability not to lose weight.  Her family is moving to [place] next month. She cares for her dad in [place].  She endorses stressors with these changes in her life although she is excited and feels this move will be positive for her family and husband.  Trouble staying asleep.  'Trouble turning her mind off'  Difficulty concentration  Feels more anxiety than depression.   No si/hi   No family or personal history of thyroid cancer.   UTD pap smear, mammogram , colonoscopy   HISTORY:  Past Medical History:  Diagnosis Date   Anxiety    Depression    GERD (gastroesophageal reflux disease)    Hypertension    IBS (irritable bowel syndrome)    Placenta previa    Past Surgical History:  Procedure Laterality Date   CESAREAN SECTION     x 1    DILATION AND CURETTAGE OF UTERUS  06/2002   due to miscarriage   VAGINAL DELIVERY     x 2   Family History  Problem Relation Age of Onset   Hyperlipidemia Mother    Hypertension Mother    Diabetes Mother    Colon polyps Mother    Heart disease Mother    Hypertension Father    Heart disease Father    Hyperlipidemia Father    Arthritis Father    Colon polyps Father    Cancer Brother 62       Brain Tumor, GBM   Hypertension Brother    Heart disease Maternal Grandfather    Colon cancer Neg Hx    Esophageal cancer Neg Hx    Stomach cancer Neg Hx    Rectal cancer Neg Hx    Thyroid cancer Neg Hx     Allergies: Patient has no known allergies. Current Outpatient Medications on File Prior to Visit  Medication Sig Dispense Refill   amLODipine (NORVASC) 5 MG tablet TAKE 1 TABLET BY MOUTH EVERY DAY 90 tablet 2   CALCIUM PO  Take 1,000 mg by mouth daily.     Cholecalciferol (VITAMIN D3 PO) Take by mouth.     Cyanocobalamin (B-12 PO) Take by mouth.     hydrochlorothiazide (MICROZIDE) 12.5 MG capsule TAKE 1 CAPSULE BY MOUTH EVERY DAY 30 capsule 6   MAGNESIUM PO Take by mouth.     Multiple Vitamins-Minerals (HAIR SKIN AND NAILS FORMULA PO) Take by mouth daily.     Omeprazole-Sodium Bicarbonate (ZEGERID) 20-1100 MG CAPS Take 1 capsule by mouth daily before breakfast. 28 each 3   sertraline (ZOLOFT) 100 MG tablet TAKE 1 TABLET BY MOUTH EVERYDAY AT BEDTIME 90 tablet 2   medroxyPROGESTERone (PROVERA) 5 MG tablet Take 1 tablet (5 mg total) by mouth daily for 10 days. Cyclic Provera every 3 months if no spontaneous menstrual period. 10 tablet 4   No current facility-administered medications on file prior to visit.    Social History   Tobacco Use   Smoking status: Never   Smokeless tobacco: Never  Vaping Use   Vaping Use: Never used  Substance Use Topics   Alcohol use: Yes    Alcohol/week: 5.0 standard drinks  Types: 5 Standard drinks or equivalent per week    Comment: 5 DRINKS A WEEK   Drug use: No    Review of Systems  Constitutional:  Positive for fatigue. Negative for chills and fever.  Respiratory:  Negative for cough.   Cardiovascular:  Negative for chest pain and palpitations.  Gastrointestinal:  Negative for nausea and vomiting.  Psychiatric/Behavioral:  Positive for decreased concentration and sleep disturbance. Negative for suicidal ideas. The patient is nervous/anxious.      Objective:    BP 118/80 (BP Location: Left Arm, Patient Position: Sitting, Cuff Size: Large)   Pulse 79   Temp 98.2 F (36.8 C) (Oral)   Ht 5' 2.5" (1.588 m)   Wt 172 lb 3.2 oz (78.1 kg)   SpO2 97%   BMI 30.99 kg/m  BP Readings from Last 3 Encounters:  06/21/21 118/80  05/24/21 112/78  06/23/20 118/80   Wt Readings from Last 3 Encounters:  06/21/21 172 lb 3.2 oz (78.1 kg)  05/24/21 170 lb (77.1 kg)  06/23/20  164 lb 3.2 oz (74.5 kg)    Physical Exam Vitals reviewed.  Constitutional:      Appearance: She is well-developed.  Eyes:     Conjunctiva/sclera: Conjunctivae normal.  Neck:     Thyroid: No thyroid mass or thyromegaly.  Cardiovascular:     Rate and Rhythm: Normal rate and regular rhythm.     Pulses: Normal pulses.     Heart sounds: Normal heart sounds.  Pulmonary:     Effort: Pulmonary effort is normal.     Breath sounds: Normal breath sounds. No wheezing, rhonchi or rales.  Lymphadenopathy:     Head:     Right side of head: No submental, submandibular, tonsillar, preauricular, posterior auricular or occipital adenopathy.     Left side of head: No submental, submandibular, tonsillar, preauricular, posterior auricular or occipital adenopathy.     Cervical: No cervical adenopathy.  Skin:    General: Skin is warm and dry.  Neurological:     Mental Status: She is alert.  Psychiatric:        Speech: Speech normal.        Behavior: Behavior normal.        Thought Content: Thought content normal.       Assessment & Plan:   Problem List Items Addressed This Visit       Cardiovascular and Mediastinum   Essential hypertension, benign (Chronic)    Stable, controlled.  Continue amlodipine 5 mg, hydrochlorothiazide 12.5         Other   GAD (generalized anxiety disorder)    Chronic, uncontrolled.  Discussed various options today including decreasing Zoloft to see if this would help with her weight loss; we also discussed increasing Zoloft due to breakthrough anxiety.  We ultimately decided to continue Zoloft 100 mg and start as needed hydroxyzine for breakthrough anxiety which is affecting sleep.  Close follow-up to discuss role of counseling, perhaps adjunct in with Wellbutrin if needed or changing to another SSRI altogether.       Relevant Medications   hydrOXYzine (ATARAX/VISTARIL) 10 MG tablet   Obesity (BMI 30-39.9) - Primary    Chronic, uncontrolled .discussed trial of  wegovy  in the setting of weight loss.  Discussed extensively blackbox warning as relates medullary thyroid cancer, MEN.  Counseled on how to inject medication and slowly titrate.  Close follow-up in 6 weeks.       Relevant Medications   Semaglutide-Weight Management (WEGOVY) 0.25 MG/0.5ML  SOAJ   Other Relevant Orders   TSH   CBC with Differential/Platelet   Comprehensive metabolic panel   Hemoglobin A1c   Lipid panel   VITAMIN D 25 Hydroxy (Vit-D Deficiency, Fractures)     I am having Deanna Schultz start on Wegovy and hydrOXYzine. I am also having her maintain her Omeprazole-Sodium Bicarbonate, Cholecalciferol (VITAMIN D3 PO), CALCIUM PO, Multiple Vitamins-Minerals (HAIR SKIN AND NAILS FORMULA PO), hydrochlorothiazide, sertraline, Cyanocobalamin (B-12 PO), MAGNESIUM PO, medroxyPROGESTERone, and amLODipine.   Meds ordered this encounter  Medications   Semaglutide-Weight Management (WEGOVY) 0.25 MG/0.5ML SOAJ    Sig: Inject 0.25 mg into the skin once a week.    Dispense:  2 mL    Refill:  2    Order Specific Question:   Supervising Provider    Answer:   Deborra Medina L [[id number]]   hydrOXYzine (ATARAX/VISTARIL) 10 MG tablet    Sig: Take 1 tablet (10 mg total) by mouth 2 (two) times daily as needed for anxiety.    Dispense:  90 tablet    Refill:  0    Order Specific Question:   Supervising Provider    Answer:   Crecencio Mc [[id number]]     Return precautions given.   Risks, benefits, and alternatives of the medications and treatment plan prescribed today were discussed, and patient expressed understanding.   Education regarding symptom management and diagnosis given to patient on AVS.  Continue to follow with Burnard Hawthorne, FNP for routine health maintenance.   Deanna Schultz and I agreed with plan.   Mable Paris, FNP

## 2021-06-21 NOTE — Assessment & Plan Note (Addendum)
Chronic, uncontrolled .discussed trial of wegovy  in the setting of weight loss.  Discussed extensively blackbox warning as relates medullary thyroid cancer, MEN.  Counseled on how to inject medication and slowly titrate.  Close follow-up in 6 weeks.

## 2021-06-22 ENCOUNTER — Telehealth: Payer: Self-pay

## 2021-06-22 NOTE — Telephone Encounter (Signed)
error 

## 2021-06-22 NOTE — Telephone Encounter (Signed)
Error

## 2021-07-07 ENCOUNTER — Other Ambulatory Visit: Payer: Self-pay

## 2021-07-14 NOTE — Telephone Encounter (Signed)
For your information  

## 2021-07-28 ENCOUNTER — Other Ambulatory Visit: Payer: Self-pay | Admitting: Family

## 2021-07-28 DIAGNOSIS — F411 Generalized anxiety disorder: Secondary | ICD-10-CM

## 2021-08-03 ENCOUNTER — Telehealth: Payer: Managed Care, Other (non HMO) | Admitting: Family

## 2021-11-04 ENCOUNTER — Other Ambulatory Visit: Payer: Self-pay | Admitting: Family

## 2021-11-04 DIAGNOSIS — F411 Generalized anxiety disorder: Secondary | ICD-10-CM

## 2021-11-08 ENCOUNTER — Other Ambulatory Visit: Payer: Self-pay | Admitting: Family

## 2021-11-08 DIAGNOSIS — F411 Generalized anxiety disorder: Secondary | ICD-10-CM

## 2021-11-09 ENCOUNTER — Encounter: Payer: Self-pay | Admitting: Family

## 2021-11-09 MED ORDER — HYDROCHLOROTHIAZIDE 12.5 MG PO CAPS: 12.5000 mg | ORAL_CAPSULE | Freq: Every day | ORAL | 1 refills | Status: DC

## 2021-12-13 ENCOUNTER — Other Ambulatory Visit: Payer: Self-pay | Admitting: Family

## 2021-12-13 DIAGNOSIS — E669 Obesity, unspecified: Secondary | ICD-10-CM

## 2021-12-15 ENCOUNTER — Other Ambulatory Visit: Payer: Self-pay | Admitting: Family

## 2021-12-15 ENCOUNTER — Encounter: Payer: Self-pay | Admitting: Family

## 2021-12-15 DIAGNOSIS — E669 Obesity, unspecified: Secondary | ICD-10-CM

## 2021-12-15 MED ORDER — WEGOVY 0.25 MG/0.5ML ~~LOC~~ SOAJ: 0.2500 mg | SUBCUTANEOUS | 2 refills | Status: DC

## 2021-12-17 NOTE — Telephone Encounter (Signed)
I called and spoke with the patient and she wants to change Wegovy to something that's is more affordable.

## 2021-12-20 ENCOUNTER — Other Ambulatory Visit: Payer: Self-pay | Admitting: Family

## 2022-01-03 ENCOUNTER — Encounter: Payer: Self-pay | Admitting: Family

## 2022-01-03 ENCOUNTER — Telehealth (INDEPENDENT_AMBULATORY_CARE_PROVIDER_SITE_OTHER): Payer: Managed Care, Other (non HMO) | Admitting: Family

## 2022-01-03 VITALS — Ht 62.5 in | Wt 167.7 lb

## 2022-01-03 DIAGNOSIS — F411 Generalized anxiety disorder: Secondary | ICD-10-CM | POA: Diagnosis not present

## 2022-01-03 DIAGNOSIS — E669 Obesity, unspecified: Secondary | ICD-10-CM

## 2022-01-03 DIAGNOSIS — I10 Essential (primary) hypertension: Secondary | ICD-10-CM | POA: Diagnosis not present

## 2022-01-03 MED ORDER — METFORMIN HCL ER 500 MG PO TB24: ORAL_TABLET | ORAL | 3 refills | Status: DC

## 2022-01-03 NOTE — Progress Notes (Signed)
Virtual Visit via Video Note  I connected with@  on 01/03/22 at  8:30 AM EST by a video enabled telemedicine application and verified that I am speaking with the correct person using two identifiers.  Location patient: home Location provider:work  Persons participating in the virtual visit: patient, provider  I discussed the limitations of evaluation and management by telemedicine and the availability of in person appointments. The patient expressed understanding and agreed to proceed.   HPI: Visit primarily to discuss weight loss medications. She has lost 15 lbs in the past couple of years. H/o prediabetes. Mother had DM.  She is following with weight loss program and tracking calories.    Insurance did not cover [phone] and she has taken for one month but will not refill due to cost.  She has tried phentermine in the past however blood pressure elevated.   Hypertension-compliant with hydrochlorothiazide 12.5 mg, amlodipine 5 mg. No cp, sob  Anxiety-compliant with Zoloft 100 mg, hydroxyzine qpm. She feels well on this regimen. Stress has decreased.    ROS: See pertinent positives and negatives per HPI.    EXAM:  VITALS per patient if applicable: Ht 5' 2.5" (1.588 m)    Wt 167 lb 11.2 oz (76.1 kg)    BMI 30.18 kg/m  BP Readings from Last 3 Encounters:  06/21/21 118/80  05/24/21 112/78  06/23/20 118/80   Wt Readings from Last 3 Encounters:  01/03/22 167 lb 11.2 oz (76.1 kg)  06/21/21 172 lb 3.2 oz (78.1 kg)  05/24/21 170 lb (77.1 kg)    GENERAL: alert, oriented, appears well and in no acute distress  HEENT: atraumatic, conjunttiva clear, no obvious abnormalities on inspection of external nose and ears  NECK: normal movements of the head and neck  LUNGS: on inspection no signs of respiratory distress, breathing rate appears normal, no obvious gross SOB, gasping or wheezing  CV: no obvious cyanosis  MS: moves all visible extremities without noticeable  abnormality  PSYCH/NEURO: pleasant and cooperative, no obvious depression or anxiety, speech and thought processing grossly intact  ASSESSMENT AND PLAN:  Discussed the following assessment and plan:  Problem List Items Addressed This Visit       Cardiovascular and Mediastinum   Essential hypertension, benign (Chronic)    Chronic, stable. Continue hydrochlorothiazide 12.5 mg, amlodipine 5 mg.        Other   GAD (generalized anxiety disorder)    Chronic, stable. Continue Zoloft 100 mg, hydroxyzine 10mg   Qpm.       Obesity (BMI 30-39.9) - Primary    H/o prediabetes. Wegovy not approved by insurance. Start metformin xr 500mg  and titrate. Close follow up.       Relevant Medications   metFORMIN (GLUCOPHAGE XR) 500 MG 24 hr tablet    -we discussed possible serious and likely etiologies, options for evaluation and workup, limitations of telemedicine visit vs in person visit, treatment, treatment risks and precautions. Pt prefers to treat via telemedicine empirically rather then risking or undertaking an in person visit at this moment.  .   I discussed the assessment and treatment plan with the patient. The patient was provided an opportunity to ask questions and all were answered. The patient agreed with the plan and demonstrated an understanding of the instructions.   The patient was advised to call back or seek an in-person evaluation if the symptoms worsen or if the condition fails to improve as anticipated.   , FNP

## 2022-01-03 NOTE — Patient Instructions (Signed)
Lets trial metformin  Start metformin XR with one 500mg tablet at night. After one week, you may increase to two tablets at night ( total of 1000mg) . The third week, you may take take two tablets at night and one tablet in the morning.  The fourth week, you may take two tablets in the morning ( 1000mg total) and two tablets at night (1000mg total). This will bring you to a maximum daily dose of 2000mg/day which is maximum dose. Along the way, if you want to increase more slowly, please do as this medication can cause GI discomfort and loose stools which usually get better with time , however some patients find that they can only tolerate a certain dose and cannot increase to maximum dose.   

## 2022-01-03 NOTE — Assessment & Plan Note (Signed)
H/o prediabetes. Wegovy not approved by insurance. Start metformin xr 500mg  and titrate. Close follow up.

## 2022-01-03 NOTE — Assessment & Plan Note (Signed)
Chronic, stable. Continue hydrochlorothiazide 12.5 mg, amlodipine 5 mg.

## 2022-01-03 NOTE — Assessment & Plan Note (Addendum)
Chronic, stable. Continue Zoloft 100 mg, hydroxyzine 10mg   Qpm.

## 2022-03-21 ENCOUNTER — Other Ambulatory Visit: Payer: Self-pay | Admitting: Family

## 2022-04-06 ENCOUNTER — Other Ambulatory Visit: Payer: Self-pay | Admitting: Family

## 2022-05-03 ENCOUNTER — Ambulatory Visit: Payer: Managed Care, Other (non HMO) | Admitting: Family

## 2022-05-03 ENCOUNTER — Encounter: Payer: Self-pay | Admitting: Family

## 2022-05-03 DIAGNOSIS — F411 Generalized anxiety disorder: Secondary | ICD-10-CM | POA: Diagnosis not present

## 2022-05-03 DIAGNOSIS — I10 Essential (primary) hypertension: Secondary | ICD-10-CM | POA: Diagnosis not present

## 2022-05-03 DIAGNOSIS — E669 Obesity, unspecified: Secondary | ICD-10-CM | POA: Diagnosis not present

## 2022-05-03 LAB — LIPID PANEL
Cholesterol: 260 mg/dL — ABNORMAL HIGH (ref 0–200)
HDL: 74.8 mg/dL (ref >39.00)
LDL Cholesterol: 162 mg/dL — ABNORMAL HIGH (ref 0–99)
NonHDL: 185.61
Total CHOL/HDL Ratio: 3
Triglycerides: 118 mg/dL (ref 0.0–149.0)
VLDL: 23.6 mg/dL (ref 0.0–40.0)

## 2022-05-03 LAB — TSH: TSH: 0.89 u[IU]/mL (ref 0.35–5.50)

## 2022-05-03 LAB — CBC WITH DIFFERENTIAL/PLATELET
Basophils Absolute: 0 10*3/uL (ref 0.0–0.1)
Basophils Relative: 0.3 % (ref 0.0–3.0)
Eosinophils Absolute: 0.1 10*3/uL (ref 0.0–0.7)
Eosinophils Relative: 1.4 % (ref 0.0–5.0)
HCT: 41 % (ref 36.0–46.0)
Hemoglobin: 13.8 g/dL (ref 12.0–15.0)
Lymphocytes Relative: 34.7 % (ref 12.0–46.0)
Lymphs Abs: 1.9 10*3/uL (ref 0.7–4.0)
MCHC: 33.6 g/dL (ref 30.0–36.0)
MCV: 95.1 fl (ref 78.0–100.0)
Monocytes Absolute: 0.5 10*3/uL (ref 0.1–1.0)
Monocytes Relative: 8.1 % (ref 3.0–12.0)
Neutro Abs: 3.1 10*3/uL (ref 1.4–7.7)
Neutrophils Relative %: 55.5 % (ref 43.0–77.0)
Platelets: 231 10*3/uL (ref 150.0–400.0)
RBC: 4.32 Mil/uL (ref 3.87–5.11)
RDW: 13.5 % (ref 11.5–15.5)
WBC: 5.6 10*3/uL (ref 4.0–10.5)

## 2022-05-03 LAB — COMPREHENSIVE METABOLIC PANEL
ALT: 19 U/L (ref 0–35)
AST: 22 U/L (ref 0–37)
Albumin: 4.4 g/dL (ref 3.5–5.2)
Alkaline Phosphatase: 52 U/L (ref 39–117)
BUN: 19 mg/dL (ref 6–23)
CO2: 26 mEq/L (ref 19–32)
Calcium: 9.7 mg/dL (ref 8.4–10.5)
Chloride: 101 mEq/L (ref 96–112)
Creatinine, Ser: 0.66 mg/dL (ref 0.40–1.20)
GFR: 101.43 mL/min (ref >60.00)
Glucose, Bld: 83 mg/dL (ref 70–99)
Potassium: 3.6 mEq/L (ref 3.5–5.1)
Sodium: 138 mEq/L (ref 135–145)
Total Bilirubin: 0.4 mg/dL (ref 0.2–1.2)
Total Protein: 7.5 g/dL (ref 6.0–8.3)

## 2022-05-03 LAB — FOLLICLE STIMULATING HORMONE: FSH: 81 m[IU]/mL

## 2022-05-03 LAB — HEMOGLOBIN A1C: Hgb A1c MFr Bld: 5.6 % (ref 4.6–6.5)

## 2022-05-03 LAB — VITAMIN D 25 HYDROXY (VIT D DEFICIENCY, FRACTURES): VITD: 51.75 ng/mL (ref 30.00–100.00)

## 2022-05-03 MED ORDER — HYDROXYZINE HCL 10 MG PO TABS: 20.0000 mg | ORAL_TABLET | Freq: Every day | ORAL | 1 refills | Status: DC

## 2022-05-03 NOTE — Assessment & Plan Note (Signed)
Chronic, stable.  Continue hydrochlorothiazide 12 mg, amlodipine 5 mg 

## 2022-05-03 NOTE — Assessment & Plan Note (Signed)
Chronic, stable.  Continue Zoloft 100 mg, Atarax 20 mg nightly

## 2022-05-03 NOTE — Progress Notes (Signed)
Subjective:    Patient ID: Deanna Schultz, female    DOB: [birth year], 51 y.o.   MRN: [id number]  CC: Deanna Schultz is a 51 y.o. female who presents today for follow up.   HPI: Feels well today.  No new complaints.  She continues to walk daily.  She is working in her garden.  She is compliant metformin 500 mg daily.  Insurance would not approve Wegovy at this time.  She is compliant with Zoloft 100 mg, Atarax 20 mg nightly which is helpful for sleep.  She feels regimen is working very well at this time  HTN-compliant with hydrochlorothiazide 12 mg, amlodipine 5 mg. No cp.    h/o preDM and family HX Dm  Mammogram is scheduled.  She continues to follow with GYN.  She suspects that she is perimenopausal as feels weight loss has been more difficult.  HISTORY:  Past Medical History:  Diagnosis Date   Anxiety    Depression    GERD (gastroesophageal reflux disease)    Hypertension    IBS (irritable bowel syndrome)    Placenta previa    Past Surgical History:  Procedure Laterality Date   CESAREAN SECTION     x 1    DILATION AND CURETTAGE OF UTERUS  06/2002   due to miscarriage   VAGINAL DELIVERY     x 2   Family History  Problem Relation Age of Onset   Hyperlipidemia Mother    Hypertension Mother    Diabetes Mother    Colon polyps Mother    Heart disease Mother    Hypertension Father    Heart disease Father    Hyperlipidemia Father    Arthritis Father    Colon polyps Father    Cancer Brother 32       Brain Tumor, GBM   Hypertension Brother    Heart disease Maternal Grandfather    Colon cancer Neg Hx    Esophageal cancer Neg Hx    Stomach cancer Neg Hx    Rectal cancer Neg Hx    Thyroid cancer Neg Hx     Allergies: Patient has no known allergies. Current Outpatient Medications on File Prior to Visit  Medication Sig Dispense Refill   amLODipine (NORVASC) 5 MG tablet TAKE 1 TABLET BY MOUTH EVERY DAY 90 tablet 2   hydrochlorothiazide (MICROZIDE) 12.5 MG  capsule TAKE 1 CAPSULE BY MOUTH EVERY DAY 90 capsule 1   LINZESS 145 MCG CAPS capsule TAKE 1 CAPSULE BY MOUTH EVERY DAY 90 capsule 3   MAGNESIUM PO Take by mouth.     metFORMIN (GLUCOPHAGE XR) 500 MG 24 hr tablet Start 500mg  PO qpm. 90 tablet 3   Omeprazole-Sodium Bicarbonate (ZEGERID) 20-1100 MG CAPS Take 1 capsule by mouth daily before breakfast. 28 each 3   sertraline (ZOLOFT) 100 MG tablet TAKE 1 TABLET BY MOUTH EVERYDAY AT BEDTIME 90 tablet 2   medroxyPROGESTERone (PROVERA) 5 MG tablet Take 1 tablet (5 mg total) by mouth daily for 10 days. Cyclic Provera every 3 months if no spontaneous menstrual period. 10 tablet 4   No current facility-administered medications on file prior to visit.    Social History   Tobacco Use   Smoking status: Never   Smokeless tobacco: Never  Vaping Use   Vaping Use: Never used  Substance Use Topics   Alcohol use: Yes    Alcohol/week: 5.0 standard drinks    Types: 5 Standard drinks or equivalent per week    Comment:  5 DRINKS A WEEK   Drug use: No    Review of Systems  Constitutional:  Negative for chills and fever.  Respiratory:  Negative for cough.   Cardiovascular:  Negative for chest pain and palpitations.  Gastrointestinal:  Negative for nausea and vomiting.  Psychiatric/Behavioral:  The patient is not nervous/anxious.      Objective:    BP 118/78 (BP Location: Left Arm, Patient Position: Sitting, Cuff Size: Large)   Pulse 65   Temp 97.7 F (36.5 C) (Oral)   Ht 5' 2.5" (1.588 m)   Wt 171 lb 9.6 oz (77.8 kg)   SpO2 98%   BMI 30.89 kg/m  BP Readings from Last 3 Encounters:  05/03/22 118/78  06/21/21 118/80  05/24/21 112/78   Wt Readings from Last 3 Encounters:  05/03/22 171 lb 9.6 oz (77.8 kg)  01/03/22 167 lb 11.2 oz (76.1 kg)  06/21/21 172 lb 3.2 oz (78.1 kg)    Physical Exam Vitals reviewed.  Constitutional:      Appearance: She is well-developed.  Eyes:     Conjunctiva/sclera: Conjunctivae normal.  Cardiovascular:      Rate and Rhythm: Normal rate and regular rhythm.     Pulses: Normal pulses.     Heart sounds: Normal heart sounds.  Pulmonary:     Effort: Pulmonary effort is normal.     Breath sounds: Normal breath sounds. No wheezing, rhonchi or rales.  Skin:    General: Skin is warm and dry.  Neurological:     Mental Status: She is alert.  Psychiatric:        Speech: Speech normal.        Behavior: Behavior normal.        Thought Content: Thought content normal.       Assessment & Plan:   Problem List Items Addressed This Visit       Cardiovascular and Mediastinum   Essential hypertension, benign (Chronic)    Chronic, stable.  Continue hydrochlorothiazide 12 mg, amlodipine 5 mg         Other   GAD (generalized anxiety disorder)    Chronic, stable.  Continue Zoloft 100 mg, Atarax 20 mg nightly       Relevant Medications   hydrOXYzine (ATARAX) 10 MG tablet   Other Relevant Orders   TSH   CBC with Differential/Platelet   Comprehensive metabolic panel   Hemoglobin A1c   Lipid panel   VITAMIN D 25 Hydroxy (Vit-D Deficiency, Fractures)   FSH   Obesity (BMI 30-39.9)    Unfortunately insurance not cover Wegovy at this time.  We will revisit this at a later date.  We discussed titrating up metformin to maximum dose of 2000 mg/day.  She will let me know how she is doing         I have discontinued Tersa L. Kervin's CALCIUM PO and Multiple Vitamins-Minerals (HAIR SKIN AND NAILS FORMULA PO). I have also changed her hydrOXYzine. Additionally, I am having her maintain her Omeprazole-Sodium Bicarbonate, MAGNESIUM PO, medroxyPROGESTERone, sertraline, amLODipine, metFORMIN, hydrochlorothiazide, and Linzess.   Meds ordered this encounter  Medications   hydrOXYzine (ATARAX) 10 MG tablet    Sig: Take 2 tablets (20 mg total) by mouth at bedtime.    Dispense:  180 tablet    Refill:  1    Order Specific Question:   Supervising Provider    Answer:   Crecencio Mc [[id number]]    Return  precautions given.   Risks, benefits, and alternatives of the  medications and treatment plan prescribed today were discussed, and patient expressed understanding.   Education regarding symptom management and diagnosis given to patient on AVS.  Continue to follow with Burnard Hawthorne, FNP for routine health maintenance.   Deanna Schultz and I agreed with plan.   Mable Paris, FNP

## 2022-05-03 NOTE — Assessment & Plan Note (Signed)
Unfortunately insurance not cover Wegovy at this time.  We will revisit this at a later date.  We discussed titrating up metformin to maximum dose of 2000 mg/day.  She will let me know how she is doing

## 2022-05-03 NOTE — Patient Instructions (Addendum)
We could consider Saxenda ( same drug class) in the future too as wegovy has been hard to find. Ozempic and Trulicity are branded for diabetes - but you can look into these as well.   Lets trial metformin  Start metformin XR with one 500mg  tablet at night. After one week, you may increase to two tablets at night ( total of 1000mg ) . The third week, you may take take two tablets at night and one tablet in the morning.  The fourth week, you may take two tablets in the morning ( 1000mg  total) and two tablets at night (1000mg  total). This will bring you to a maximum daily dose of 2000mg /day which is maximum dose. Along the way, if you want to increase more slowly, please do as this medication can cause GI discomfort and loose stools which usually get better with time , however some patients find that they can only tolerate a certain dose and cannot increase to maximum dose.

## 2022-05-05 ENCOUNTER — Encounter: Payer: Self-pay | Admitting: Family

## 2022-05-06 ENCOUNTER — Other Ambulatory Visit: Payer: Self-pay | Admitting: Family

## 2022-05-06 DIAGNOSIS — E785 Hyperlipidemia, unspecified: Secondary | ICD-10-CM

## 2022-05-14 ENCOUNTER — Encounter: Payer: Self-pay | Admitting: Family

## 2022-05-16 ENCOUNTER — Other Ambulatory Visit: Payer: Self-pay

## 2022-05-16 DIAGNOSIS — E669 Obesity, unspecified: Secondary | ICD-10-CM

## 2022-05-16 MED ORDER — METFORMIN HCL ER 500 MG PO TB24: ORAL_TABLET | ORAL | 3 refills | Status: DC

## 2022-06-01 ENCOUNTER — Encounter: Payer: Self-pay | Admitting: Obstetrics & Gynecology

## 2022-06-02 ENCOUNTER — Encounter: Payer: Self-pay | Admitting: Obstetrics & Gynecology

## 2022-06-02 ENCOUNTER — Ambulatory Visit (INDEPENDENT_AMBULATORY_CARE_PROVIDER_SITE_OTHER): Payer: Managed Care, Other (non HMO) | Admitting: Obstetrics & Gynecology

## 2022-06-02 VITALS — BP 114/70 | HR 78 | Ht 62.75 in | Wt 173.0 lb

## 2022-06-02 DIAGNOSIS — Z01419 Encounter for gynecological examination (general) (routine) without abnormal findings: Secondary | ICD-10-CM

## 2022-06-02 DIAGNOSIS — N914 Secondary oligomenorrhea: Secondary | ICD-10-CM

## 2022-06-02 DIAGNOSIS — Z9189 Other specified personal risk factors, not elsewhere classified: Secondary | ICD-10-CM

## 2022-06-02 MED ORDER — MEDROXYPROGESTERONE ACETATE 5 MG PO TABS
5.0000 mg | ORAL_TABLET | Freq: Every day | ORAL | 4 refills | Status: DC
Start: 2022-06-02 — End: 2022-09-08

## 2022-06-02 NOTE — Progress Notes (Signed)
Deanna Schultz 1971/09/01 [id number]   History:    51 y.o. S2G3T5V7 Married.  Lost her mother 11/2018. Husband is a Emergency planning/management officer.  Son 74 yo working and married, daughters 71 and 24 yo.   RP:  Established patient presenting for annual gyn exam   HPI:  Oligomenorrhea on cyclic Provera.  Last light menses post Provera was 04/26/22.  No pelvic pain.  No pain with intercourse.  Vasectomy.  Pap 04/2021 Neg.  No h/o abnormal Pap.  Will repeat Pap at 2-3 yrs. Urine and bowel movements normal.  Breasts normal.  Mammo 05/2022 Neg.  BMI 30.89.  Walking daily.  Decreasing carbs.  Health labs with family NP.  Colono 12/2019.    Past medical history,surgical history, family history and social history were all reviewed and documented in the EPIC chart.  Gynecologic History Patient's last menstrual period was 04/26/2022 (exact date).  Obstetric History OB History  Gravida Para Term Preterm AB Living  4 3 3   1 3   SAB IAB Ectopic Multiple Live Births  1       3    # Outcome Date GA Lbr Len/2nd Weight Sex Delivery Anes PTL Lv  4 SAB           3 Term           2 Term           1 Term              ROS: A ROS was performed and pertinent positives and negatives are included in the history. GENERAL: No fevers or chills. HEENT: No change in vision, no earache, sore throat or sinus congestion. NECK: No pain or stiffness. CARDIOVASCULAR: No chest pain or pressure. No palpitations. PULMONARY: No shortness of breath, cough or wheeze. GASTROINTESTINAL: No abdominal pain, nausea, vomiting or diarrhea, melena or bright red blood per rectum. GENITOURINARY: No urinary frequency, urgency, hesitancy or dysuria. MUSCULOSKELETAL: No joint or muscle pain, no back pain, no recent trauma. DERMATOLOGIC: No rash, no itching, no lesions. ENDOCRINE: No polyuria, polydipsia, no heat or cold intolerance. No recent change in weight. HEMATOLOGICAL: No anemia or easy bruising or bleeding. NEUROLOGIC: No headache, seizures,  numbness, tingling or weakness. PSYCHIATRIC: No depression, no loss of interest in normal activity or change in sleep pattern.     Exam:   BP 114/70   Pulse 78   Ht 5' 2.75" (1.594 m)   Wt 173 lb (78.5 kg)   LMP 04/26/2022 (Exact Date)   SpO2 98%   BMI 30.89 kg/m   Body mass index is 30.89 kg/m.  General appearance : Well developed well nourished female. No acute distress HEENT: Eyes: no retinal hemorrhage or exudates,  Neck supple, trachea midline, no carotid bruits, no thyroidmegaly Lungs: Clear to auscultation, no rhonchi or wheezes, or rib retractions  Heart: Regular rate and rhythm, no murmurs or gallops Breast:Examined in sitting and supine position were symmetrical in appearance, no palpable masses or tenderness,  no skin retraction, no nipple inversion, no nipple discharge, no skin discoloration, no axillary or supraclavicular lymphadenopathy Abdomen: no palpable masses or tenderness, no rebound or guarding Extremities: no edema or skin discoloration or tenderness  Pelvic: Vulva: Normal             Vagina: No gross lesions or discharge  Cervix: No gross lesions or discharge.    Uterus  AV, normal size, shape and consistency, non-tender and mobile  Adnexa  Without masses or tenderness  Anus: Normal   Assessment/Plan:  51 y.o. female for annual exam   1. Well female exam with routine gynecological exam Oligomenorrhea on cyclic Provera.  Last light menses post Provera was 04/26/22.  No pelvic pain.  No pain with intercourse.  Vasectomy.  Pap 04/2021 Neg.  No h/o abnormal Pap.  Will repeat Pap at 2-3 yrs. Urine and bowel movements normal.  Breasts normal.  Mammo 05/2022 Neg.  BMI 30.89.  Walking daily.  Decreasing carbs.  Health labs with family NP.  Colono 12/2019.   2. Relies on partner vasectomy for contraception  3. Secondary oligomenorrhea/Perimenopause Oligomenorrhea on cyclic Provera 5 mg PO daily x 10 days every 3 months.  Last light menses post Provera was 04/26/22.   No pelvic pain.  No pain with intercourse.  Vasectomy.   Other orders - Omega-3 Fatty Acids (FISH OIL PO); Take by mouth. - medroxyPROGESTERone (PROVERA) 5 MG tablet; Take 1 tablet (5 mg total) by mouth daily for 10 days. Cyclic Provera every 3 months if no spontaneous menstrual period.   Genia Del MD, 2:13 PM 06/02/2022

## 2022-06-08 NOTE — Progress Notes (Signed)
Primary Care Provider: Allegra Grana, FNP [facility] Cardiologist: None Electrophysiologist: None  Clinic Note: Chief Complaint  Patient presents with   New Patient (Initial Visit)    Interested in vascular risk-strong family history of CAD   Hyperlipidemia    Significant elevated LDL-question about treatment   ===================================  ASSESSMENT/PLAN   Problem List Items Addressed This Visit       Cardiology Problems   Essential hypertension, benign (Chronic)    Her blood pressure is pretty well controlled at home, but high here today because she is somewhat scared.  She is very anxious about being in the cardiologist office.  We will reassess based on her upcoming follow-up visit.  She is on amlodipine at baseline along with HCTZ.  Heart rate of 66, would probably avoid beta-blocker for now.      Relevant Orders   EKG 12-Lead   CT CARDIAC SCORING (SELF PAY ONLY) (Completed)   Hyperlipidemia with target LDL less than 100 (Chronic)    Recently check labs showed LDL of 162 with a total cholesterol 216.  Simply with her family history, I think we should target an LDL less than 100.  From a CRF standpoint, she is obese with hypertension, but not diabetic, and her triglycerides/HDL levels are okay.  She only meets 2 out of 3 criteria and therefore does not meet criteria for metabolic syndrome diagnosis.  Poor baseline risk stratification as far as her underlying risk, we talked about various testing techniques.  We talked about stress test would probably not be a little effective since she exercises daily and is not having symptoms.  Would not looking for obstructive disease were looking for baseline risk.  The only real good screening test we have is Coronary Calcium Score to estimate the extent of coronary artery disease.  I did explain to her this is an out-of-pocket cost and she agrees to proceed.  Plan: Check Coronary Calcium Score Also discussed  likely consideration of low-dose rosuvastatin regardless of findings, but would increase dose if there is significant evidence of CAD on Coronary Calcium Score.      Relevant Orders   EKG 12-Lead   CT CARDIAC SCORING (SELF PAY ONLY) (Completed)     Other   Family history of premature CAD - Primary (Chronic)    Pretty significant family history which by itself would drive the desire to bring risk factors have been into control.  Dr. Laneta Simmers importance of diet and exercise and weight loss.  She is doing a good job with exercise, but still working on diet.  Next risks are glycemic control her A1c is pretty well controlled but her lipids are poorly controlled.  Low threshold to initiate therapy.  Plan: Risk stratify with Coronary Calcium Score first.      Relevant Orders   EKG 12-Lead   CT CARDIAC SCORING (SELF PAY ONLY) (Completed)    ===================================  HPI:    Deanna Schultz is an ~obese 51 y.o. female (NON-SMOKER) mother of 3 with a PMH notable for significant Anxiety/Depression along with HTN, pre-DM-2 and IBS as well as a family history notable for heart disease in her mother and father as well as maternal grandfather (unknown details) who is being seen today for BASELINE CARDIOVASCULAR RISK ASSESSMENT at the request of Jason Coop, Lyn Records, FNP.  Deanna Schultz was last seen on 05/03/22 by Rennie Plowman, FNP for routine follow-up indicating that she is feeling well with no major complaints.  Walking daily  and working in the garden.  Was open to use [facility] for weight loss, but this was not approved. Titrated metformin up to 2000 MG  daily--for prediabetes and attended weight loss. Using hydroxyzine as needed for anxiety Patient contacted PCP 3 days after clinic visit asking for referral to cardiology  Recent Hospitalizations: None  Reviewed  CV studies:    The following studies were reviewed today: (if available, images/films reviewed: From Epic Chart or Care  Everywhere) None:  Interval History:   Deanna Schultz presents here today for cardiology evaluation mostly because she is concerned about her significant family history of CAD. Father had CAD- CABG in his early 50s; smoker, HTN & HLD Mother had CVA ~74 - did not recover.  CAD prior to that. - smoker, DM, HTN & HLD.   Partly because she is so concerned about her family history of CAD, she is wanted to make adjustments to her lifestyle to reduce her overall risk.  She is making conscious effort to change lifestyle by adjusting her diet and increasing her exercise level.  She exercises ~5 x per week, walks ~50 min, light weights, mows lawn with push mower.  With all of the activities she denies any cardiac symptoms of chest pain, pressure or dyspnea.  Thankfully, she has not had any irregular heartbeats palpitations.  No syncope/near syncope or TIA/amaurosis fugax.  No claudication.  No heart failure symptoms of PND or orthopnea with only minimal end of day edema.  No claudication. \ CV Review of Symptoms (Summary) Cardiovascular ROS: no chest pain or dyspnea on exertion positive for - edema and this is mostly mild end  of day swelling - worse in summer negative for - irregular heartbeat, orthopnea, palpitations, paroxysmal nocturnal dyspnea, rapid heart rate, shortness of breath, or lightheaded/ wooziness, syncope/near syncope, TIA/amaurosis fugax, claudication.  REVIEWED OF SYSTEMS   Review of Systems  Constitutional:  Negative for malaise/fatigue and weight loss.  HENT:  Negative for nosebleeds.   Respiratory:  Negative for cough and shortness of breath.   Cardiovascular:        Per HPI  Gastrointestinal:  Negative for blood in stool, diarrhea and melena.  Genitourinary:  Negative for hematuria.  Musculoskeletal:  Negative for falls, joint pain and myalgias (mild muscle aches).  Neurological:  Negative for dizziness, seizures, loss of consciousness, weakness and headaches.   Endo/Heme/Allergies:  Negative for environmental allergies.  Psychiatric/Behavioral:  Negative for depression and memory loss. The patient is nervous/anxious (anxious @ MD office). The patient does not have insomnia.    I have reviewed and (if needed) personally updated the patient's problem list, medications, allergies, past medical and surgical history, social and family history.   PAST MEDICAL HISTORY   Past Medical History:  Diagnosis Date   Anxiety    Depression    GERD (gastroesophageal reflux disease)    Hypertension    IBS (irritable bowel syndrome)    Placenta previa     PAST SURGICAL HISTORY   Past Surgical History:  Procedure Laterality Date   CESAREAN SECTION     x 1    DILATION AND CURETTAGE OF UTERUS  06/2002   due to miscarriage   VAGINAL DELIVERY     x 2    Immunization History  Administered Date(s) Administered   Influenza,inj,Quad PF,6+ Mos 08/29/2013, 08/13/2015, 08/09/2019, 07/25/2020, 10/03/2021   Influenza-Unspecified 09/10/2014   Moderna Sars-Covid-2 Vaccination 02/14/2020, 03/17/2020   PFIZER(Purple Top)SARS-COV-2 Vaccination 11/06/2020   Pfizer Covid-19 Vaccine Bivalent Booster 35yrs &  up 10/03/2021   Tdap 06/19/2019   Zoster Recombinat (Shingrix) 03/07/2021, 11/09/2021    MEDICATIONS/ALLERGIES   Current Meds  Medication Sig   amLODipine (NORVASC) 5 MG tablet TAKE 1 TABLET BY MOUTH EVERY DAY   hydrochlorothiazide (MICROZIDE) 12.5 MG capsule TAKE 1 CAPSULE BY MOUTH EVERY DAY   hydrOXYzine (ATARAX) 10 MG tablet Take 2 tablets (20 mg total) by mouth at bedtime.   LINZESS 145 MCG CAPS capsule TAKE 1 CAPSULE BY MOUTH EVERY DAY   medroxyPROGESTERone (PROVERA) 5 MG tablet Take 1 tablet (5 mg total) by mouth daily for 10 days. Cyclic Provera every 3 months if no spontaneous menstrual period.   metFORMIN (GLUCOPHAGE XR) 500 MG 24 hr tablet Take two tablets 1000 mg every morning and two tablets 1000 mg in the evening with meals.   Omega-3 Fatty Acids  (FISH OIL PO) Take by mouth.   Omeprazole-Sodium Bicarbonate (ZEGERID) 20-1100 MG CAPS Take 1 capsule by mouth daily before breakfast.   sertraline (ZOLOFT) 100 MG tablet TAKE 1 TABLET BY MOUTH EVERYDAY AT BEDTIME    No Known Allergies  SOCIAL HISTORY/FAMILY HISTORY   Reviewed in Epic:   Social History   Tobacco Use   Smoking status: Never   Smokeless tobacco: Never  Vaping Use   Vaping Use: Never used  Substance Use Topics   Alcohol use: Yes    Alcohol/week: 5.0 standard drinks of alcohol    Types: 5 Standard drinks or equivalent per week    Comment: 5 DRINKS A WEEK   Drug use: No   Social History   Social History Narrative   Lives in [place] with husband and 3 children, 2 boys and one girl  who is the youngest (28)       Marina Gravel, Dog in house. Homemaker.      Regular Exercise -  YES, 5 days a week   Daily Caffeine Use:  4 cups coffee         Family History  Problem Relation Age of Onset   Hyperlipidemia Mother    Hypertension Mother    Diabetes Mother    Colon polyps Mother    Heart disease Mother    Hypertension Father    Heart disease Father    Hyperlipidemia Father    Arthritis Father    Colon polyps Father    Cancer Brother 69       Brain Tumor, GBM   Hypertension Brother    Heart disease Maternal Grandfather    Colon cancer Neg Hx    Esophageal cancer Neg Hx    Stomach cancer Neg Hx    Rectal cancer Neg Hx    Thyroid cancer Neg Hx     OBJCTIVE -PE, EKG, labs   Wt Readings from Last 3 Encounters:  06/09/22 174 lb 3.2 oz (79 kg)  06/02/22 173 lb (78.5 kg)  05/03/22 171 lb 9.6 oz (77.8 kg)    Physical Exam: BP 130/89   Pulse 66   Ht 5\' 2"  (1.575 m)   Wt 174 lb 3.2 oz (79 kg)   LMP 04/26/2022 (Exact Date)   SpO2 99%   BMI 31.86 kg/m  Physical Exam Vitals reviewed.  Constitutional:      General: She is not in acute distress.    Appearance: Normal appearance. She is obese. She is not ill-appearing or toxic-appearing.  HENT:     Head:  Normocephalic and atraumatic.  Eyes:     Extraocular Movements: Extraocular movements intact.  Conjunctiva/sclera: Conjunctivae normal.     Pupils: Pupils are equal, round, and reactive to light.  Neck:     Vascular: No carotid bruit.  Cardiovascular:     Rate and Rhythm: Normal rate and regular rhythm.     Pulses: Normal pulses.     Heart sounds: Normal heart sounds. No murmur heard.    No friction rub. No gallop.  Pulmonary:     Effort: Pulmonary effort is normal. No respiratory distress.     Breath sounds: Normal breath sounds. No wheezing, rhonchi or rales.  Chest:     Chest wall: No tenderness.  Musculoskeletal:        General: No swelling. Normal range of motion.     Cervical back: Normal range of motion and neck supple.  Skin:    General: Skin is warm and dry.  Neurological:     General: No focal deficit present.     Mental Status: She is alert and oriented to person, place, and time.     Cranial Nerves: No cranial nerve deficit.     Motor: No weakness.     Gait: Gait normal.  Psychiatric:        Behavior: Behavior normal.        Thought Content: Thought content normal.        Judgment: Judgment normal.     Adult ECG Report  Rate: 66 ;  Rhythm: Mild nonspecific ST-T wave changes.;  Normal axis, intervals and durations.  Narrative Interpretation: Borderline EKG  Recent Labs: Reviewed. Lab Results  Component Value Date   CHOL 260 (H) 05/03/2022   HDL 74.80 05/03/2022   LDLCALC 162 (H) 05/03/2022   TRIG 118.0 05/03/2022   CHOLHDL 3 05/03/2022   Lab Results  Component Value Date   CREATININE 0.66 05/03/2022   BUN 19 05/03/2022   NA 138 05/03/2022   K 3.6 05/03/2022   CL 101 05/03/2022   CO2 26 05/03/2022      Latest Ref Rng & Units 05/03/2022    8:34 AM 06/21/2021   11:21 AM 06/23/2020    9:43 AM  CBC  WBC 4.0 - 10.5 K/uL 5.6  7.1  5.4   Hemoglobin 12.0 - 15.0 g/dL 13.8  13.5  14.0   Hematocrit 36.0 - 46.0 % 41.0  40.4  41.5   Platelets 150.0 - 400.0  K/uL 231.0  231.0  195.0     Lab Results  Component Value Date   HGBA1C 5.6 05/03/2022   Lab Results  Component Value Date   TSH 0.89 05/03/2022    ================================================== I spent a total of 24 minutes with the patient spent in direct patient consultation.  Additional time spent with chart review  / charting (studies, outside notes, etc): 17 min Total Time: 40 min  Current medicines are reviewed at length with the patient today.  (+/- concerns) none  Notice: This dictation was prepared with Dragon dictation along with smart phrase technology. Any transcriptional errors that result from this process are unintentional and may not be corrected upon review.   Studies Ordered:  Orders Placed This Encounter  Procedures   CT CARDIAC SCORING (SELF PAY ONLY)   EKG 12-Lead   No orders of the defined types were placed in this encounter.   Patient Instructions / Medication Changes & Studies & Tests Ordered   Patient Instructions  Medication Instructions:   Your physician recommends that you continue on your current medications as directed. Please refer to the Current Medication list  given to you today.  *If you need a refill on your cardiac medications before your next appointment, please call your pharmacy*   Lab Work:  None ordered  Testing/Procedures:  We will order CT coronary calcium score  $99 at our [facility] in [place]    Please call [phone] to schedule with "[facility]  [address]  [place], [place] [address]   Follow-Up: At [facility], you and your health needs are our priority.  As part of our continuing mission to provide you with exceptional heart care, we have created designated Provider Care Teams.  These Care Teams include your primary Cardiologist (physician) and Advanced Practice Providers (APPs -  Physician Assistants and Nurse Practitioners) who  all work together to provide you with the care you need, when you need it.  We recommend signing up for the patient portal called "MyChart".  Sign up information is provided on this After Visit Summary.  MyChart is used to connect with patients for Virtual Visits (Telemedicine).  Patients are able to view lab/test results, encounter notes, upcoming appointments, etc.  Non-urgent messages can be sent to your provider as well.   To learn more about what you can do with MyChart, go to [email or web address].    Your next appointment:    Follow up after calcium scoring  Important Information About Sugar          Bryan Lemma, M.D., M.S. Interventional Cardiologist   Pager # [phone] Phone # [phone] [address] [place], [place] [address]   Thank you for choosing [facility] in [place]!!

## 2022-06-09 ENCOUNTER — Encounter: Payer: Self-pay | Admitting: Cardiology

## 2022-06-09 ENCOUNTER — Ambulatory Visit: Payer: Managed Care, Other (non HMO) | Admitting: Cardiology

## 2022-06-09 VITALS — BP 130/89 | HR 66 | Ht 62.0 in | Wt 174.2 lb

## 2022-06-09 DIAGNOSIS — I10 Essential (primary) hypertension: Secondary | ICD-10-CM

## 2022-06-09 DIAGNOSIS — Z8249 Family history of ischemic heart disease and other diseases of the circulatory system: Secondary | ICD-10-CM

## 2022-06-09 DIAGNOSIS — E785 Hyperlipidemia, unspecified: Secondary | ICD-10-CM | POA: Diagnosis not present

## 2022-06-09 NOTE — Patient Instructions (Signed)
Medication Instructions:   Your physician recommends that you continue on your current medications as directed. Please refer to the Current Medication list given to you today.  *If you need a refill on your cardiac medications before your next appointment, please call your pharmacy*   Lab Work:  None ordered  Testing/Procedures:  We will order CT coronary calcium score  $99 at our [facility] in [place]    Please call [phone] to schedule with "[facility]  [address]  [place], [place] [address]   Follow-Up: At [facility], you and your health needs are our priority.  As part of our continuing mission to provide you with exceptional heart care, we have created designated Provider Care Teams.  These Care Teams include your primary Cardiologist (physician) and Advanced Practice Providers (APPs -  Physician Assistants and Nurse Practitioners) who all work together to provide you with the care you need, when you need it.  We recommend signing up for the patient portal called "MyChart".  Sign up information is provided on this After Visit Summary.  MyChart is used to connect with patients for Virtual Visits (Telemedicine).  Patients are able to view lab/test results, encounter notes, upcoming appointments, etc.  Non-urgent messages can be sent to your provider as well.   To learn more about what you can do with MyChart, go to [email or web address].    Your next appointment:    Follow up after calcium scoring  Important Information About Sugar

## 2022-06-10 ENCOUNTER — Ambulatory Visit
Admission: RE | Admit: 2022-06-10 | Discharge: 2022-06-10 | Disposition: A | Discharge status: Home / Self Care | Payer: Managed Care, Other (non HMO) | Source: Ambulatory Visit | Attending: Cardiology | Admitting: Cardiology

## 2022-06-10 DIAGNOSIS — Z8249 Family history of ischemic heart disease and other diseases of the circulatory system: Secondary | ICD-10-CM

## 2022-06-10 DIAGNOSIS — I10 Essential (primary) hypertension: Secondary | ICD-10-CM

## 2022-06-10 DIAGNOSIS — E785 Hyperlipidemia, unspecified: Secondary | ICD-10-CM

## 2022-06-11 ENCOUNTER — Encounter: Payer: Self-pay | Admitting: Cardiology

## 2022-06-11 NOTE — Assessment & Plan Note (Signed)
Recently check labs showed LDL of 162 with a total cholesterol 216.  Simply with her family history, I think we should target an LDL less than 100.  From a CRF standpoint, she is obese with hypertension, but not diabetic, and her triglycerides/HDL levels are okay.  She only meets 2 out of 3 criteria and therefore does not meet criteria for metabolic syndrome diagnosis.  Poor baseline risk stratification as far as her underlying risk, we talked about various testing techniques.  We talked about stress test would probably not be a little effective since she exercises daily and is not having symptoms.  Would not looking for obstructive disease were looking for baseline risk.  The only real good screening test we have is Coronary Calcium Score to estimate the extent of coronary artery disease.  I did explain to her this is an out-of-pocket cost and she agrees to proceed.  Plan: Check Coronary Calcium Score  Also discussed likely consideration of low-dose rosuvastatin regardless of findings, but would increase dose if there is significant evidence of CAD on Coronary Calcium Score.

## 2022-06-11 NOTE — Assessment & Plan Note (Signed)
Her blood pressure is pretty well controlled at home, but high here today because she is somewhat scared.  She is very anxious about being in the cardiologist office.  We will reassess based on her upcoming follow-up visit.  She is on amlodipine at baseline along with HCTZ.  Heart rate of 66, would probably avoid beta-blocker for now.

## 2022-06-11 NOTE — Assessment & Plan Note (Signed)
Pretty significant family history which by itself would drive the desire to bring risk factors have been into control.  Dr. Laneta Simmers importance of diet and exercise and weight loss.  She is doing a good job with exercise, but still working on diet.  Next risks are glycemic control her A1c is pretty well controlled but her lipids are poorly controlled.  Low threshold to initiate therapy.  Plan: Risk stratify with Coronary Calcium Score first.

## 2022-07-14 ENCOUNTER — Other Ambulatory Visit: Payer: Self-pay | Admitting: Family

## 2022-07-14 DIAGNOSIS — F411 Generalized anxiety disorder: Secondary | ICD-10-CM

## 2022-07-28 ENCOUNTER — Ambulatory Visit: Payer: Managed Care, Other (non HMO) | Admitting: Cardiology

## 2022-08-06 ENCOUNTER — Other Ambulatory Visit: Payer: Self-pay | Admitting: Family

## 2022-08-06 DIAGNOSIS — E669 Obesity, unspecified: Secondary | ICD-10-CM

## 2022-08-07 ENCOUNTER — Encounter: Payer: Self-pay | Admitting: Family

## 2022-08-10 ENCOUNTER — Encounter: Payer: Self-pay | Admitting: Family

## 2022-08-10 ENCOUNTER — Ambulatory Visit: Payer: Managed Care, Other (non HMO)

## 2022-08-10 ENCOUNTER — Ambulatory Visit: Payer: Managed Care, Other (non HMO) | Admitting: Family

## 2022-08-10 VITALS — BP 120/70 | HR 65 | Temp 98.1°F | Resp 14 | Ht 62.0 in | Wt 169.8 lb

## 2022-08-10 DIAGNOSIS — G629 Polyneuropathy, unspecified: Secondary | ICD-10-CM | POA: Insufficient documentation

## 2022-08-10 DIAGNOSIS — G6289 Other specified polyneuropathies: Secondary | ICD-10-CM

## 2022-08-10 LAB — B12 AND FOLATE PANEL
Folate: 11.5 ng/mL (ref >5.9)
Vitamin B-12: 447 pg/mL (ref 211–911)

## 2022-08-10 MED ORDER — MELOXICAM 7.5 MG PO TABS: 7.5000 mg | ORAL_TABLET | Freq: Every day | ORAL | 1 refills | Status: DC | PRN

## 2022-08-10 NOTE — Assessment & Plan Note (Signed)
Etiology nonspecific at this time.  Discussed differentials including cervical stenosis, carpal tunnel, B12 deficiency.  Pending labs x-rays at this time.  Advise she may start meloxicam as needed for now.

## 2022-08-10 NOTE — Progress Notes (Signed)
Subjective:    Patient ID: Deanna Schultz, female    DOB: [birth year], 51 y.o.   MRN: [id number]  CC: Deanna Schultz is a 51 y.o. female who presents today for an acute visit.    HPI: Complains of bilateral numbness in hands going on for couple months.  Describes as pins and needles She wakes up from sleeping and most noticeable in the morning and will ease off during the day.  She will occassional feel numbness in feet.  She may sleep on one side or another.   No neck pain injury, joint swelling     H/o IBS Non smoker   HISTORY:  Past Medical History:  Diagnosis Date   Anxiety    Depression    GERD (gastroesophageal reflux disease)    Hypertension    IBS (irritable bowel syndrome)    Placenta previa    Past Surgical History:  Procedure Laterality Date   CESAREAN SECTION     x 1    DILATION AND CURETTAGE OF UTERUS  06/2002   due to miscarriage   VAGINAL DELIVERY     x 2   Family History  Problem Relation Age of Onset   Hyperlipidemia Mother    Hypertension Mother    Diabetes Mother    Colon polyps Mother    Heart disease Mother    Hypertension Father    Heart disease Father    Hyperlipidemia Father    Arthritis Father    Colon polyps Father    Cancer Brother 76       Brain Tumor, GBM   Hypertension Brother    Heart disease Maternal Grandfather    Colon cancer Neg Hx    Esophageal cancer Neg Hx    Stomach cancer Neg Hx    Rectal cancer Neg Hx    Thyroid cancer Neg Hx     Allergies: Patient has no known allergies. Current Outpatient Medications on File Prior to Visit  Medication Sig Dispense Refill   amLODipine (NORVASC) 5 MG tablet TAKE 1 TABLET BY MOUTH EVERY DAY 90 tablet 2   hydrochlorothiazide (MICROZIDE) 12.5 MG capsule TAKE 1 CAPSULE BY MOUTH EVERY DAY 90 capsule 1   hydrOXYzine (ATARAX) 10 MG tablet TAKE 1 TABLET (10 MG TOTAL) BY MOUTH TWICE A DAY AS NEEDED FOR ANXIETY 180 tablet 1   LINZESS 145 MCG CAPS capsule TAKE 1 CAPSULE BY MOUTH  EVERY DAY 90 capsule 3   medroxyPROGESTERone (PROVERA) 5 MG tablet Take 1 tablet (5 mg total) by mouth daily for 10 days. Cyclic Provera every 3 months if no spontaneous menstrual period. 10 tablet 4   metFORMIN (GLUCOPHAGE-XR) 500 MG 24 hr tablet TAKE TWO TABLETS 1000 MG EVERY MORNING AND TWO TABLETS 1000 MG IN THE EVENING WITH MEALS. 336 tablet 1   Omega-3 Fatty Acids (FISH OIL PO) Take by mouth.     Omeprazole-Sodium Bicarbonate (ZEGERID) 20-1100 MG CAPS Take 1 capsule by mouth daily before breakfast. 28 each 3   sertraline (ZOLOFT) 100 MG tablet TAKE 1 TABLET BY MOUTH EVERYDAY AT BEDTIME 90 tablet 2   No current facility-administered medications on file prior to visit.    Social History   Tobacco Use   Smoking status: Never   Smokeless tobacco: Never  Vaping Use   Vaping Use: Never used  Substance Use Topics   Alcohol use: Yes    Alcohol/week: 5.0 standard drinks of alcohol    Types: 5 Standard drinks or equivalent per week  Comment: 5 DRINKS A WEEK   Drug use: No    Review of Systems  Constitutional:  Negative for chills and fever.  Respiratory:  Negative for cough.   Cardiovascular:  Negative for chest pain and palpitations.  Gastrointestinal:  Negative for nausea and vomiting.  Musculoskeletal:  Negative for joint swelling and neck pain.  Neurological:  Positive for numbness.      Objective:    BP 120/70 (BP Location: Left Arm, Patient Position: Sitting, Cuff Size: Normal)   Pulse 65   Temp 98.1 F (36.7 C) (Oral)   Resp 14   Ht 5\' 2"  (1.575 m)   Wt 169 lb 12.8 oz (77 kg)   SpO2 98%   BMI 31.06 kg/m   Wt Readings from Last 3 Encounters:  08/10/22 169 lb 12.8 oz (77 kg)  06/09/22 174 lb 3.2 oz (79 kg)  06/02/22 173 lb (78.5 kg)    Physical Exam Vitals reviewed.  Constitutional:      Appearance: She is well-developed.  Eyes:     Conjunctiva/sclera: Conjunctivae normal.  Cardiovascular:     Rate and Rhythm: Normal rate and regular rhythm.     Pulses:  Normal pulses.     Heart sounds: Normal heart sounds.  Pulmonary:     Effort: Pulmonary effort is normal.     Breath sounds: Normal breath sounds. No wheezing, rhonchi or rales.  Musculoskeletal:     Cervical back: No swelling, tenderness or bony tenderness. No pain with movement.     Comments: Negative phalen, tinel test. Grip strength equal bilaterally.  Sensation intact.  Palpable radial pulses.  Skin:    General: Skin is warm and dry.  Neurological:     Mental Status: She is alert.  Psychiatric:        Speech: Speech normal.        Behavior: Behavior normal.        Thought Content: Thought content normal.        Assessment & Plan:   Problem List Items Addressed This Visit       Nervous and Auditory   Peripheral neuropathy - Primary    Etiology nonspecific at this time.  Discussed differentials including cervical stenosis, carpal tunnel, B12 deficiency.  Pending labs x-rays at this time.  Advise she may start meloxicam as needed for now.      Relevant Medications   meloxicam (MOBIC) 7.5 MG tablet   Other Relevant Orders   DG Cervical Spine Complete   DG Wrist Complete Left   DG Wrist Complete Right   B12 and Folate Panel (Completed)      I am having Sandra L. Josephs start on meloxicam. I am also having her maintain her Omeprazole-Sodium Bicarbonate, sertraline, amLODipine, hydrochlorothiazide, Linzess, Omega-3 Fatty Acids (FISH OIL PO), medroxyPROGESTERone, hydrOXYzine, and metFORMIN.   Meds ordered this encounter  Medications   meloxicam (MOBIC) 7.5 MG tablet    Sig: Take 1 tablet (7.5 mg total) by mouth daily as needed for pain.    Dispense:  30 tablet    Refill:  1    Order Specific Question:   Supervising Provider    Answer:   Crecencio Mc [[id number]]    Return precautions given.   Risks, benefits, and alternatives of the medications and treatment plan prescribed today were discussed, and patient expressed understanding.   Education regarding symptom  management and diagnosis given to patient on AVS.  Continue to follow with Burnard Hawthorne, FNP for routine health maintenance.  Deanna Schultz and I agreed with plan.   Mable Paris, FNP

## 2022-08-10 NOTE — Patient Instructions (Signed)
We will start by looking at B12 labs and x-rays.  Differential includes carpal tunnel syndrome, B12 deficiency.  I sent in an anti-inflammatory meloxicam for you to trial as well.  A couple of points in regards to meloxicam ( Mobic) -  This medication is not intended for daily , long term use. It is a potent anti inflammatory ( NSAID), and my intention is for you take as needed for moderate to severe pain. If you find yourself using daily, please let me know.   Please takes Mobic ( meloxicam) with FOOD since it is an anti-inflammatory as it can cause a GI bleed or ulcer. If you have a history of GI bleed or ulcer, please do NOT take.  Do no take over the counter aleve, motrin, advil, goody's powder for pain as they are also NSAIDs, and they are  in the same class as Mobic  Lastly, we will need to monitor kidney function while on Mobic, and if we were to see any decline in kidney function in the future, we would have to discontinue this medication.

## 2022-08-15 ENCOUNTER — Encounter: Payer: Self-pay | Admitting: Family

## 2022-09-01 ENCOUNTER — Ambulatory Visit: Payer: Managed Care, Other (non HMO) | Admitting: Cardiology

## 2022-09-08 ENCOUNTER — Ambulatory Visit: Payer: Managed Care, Other (non HMO) | Attending: Cardiology | Admitting: Cardiology

## 2022-09-08 ENCOUNTER — Encounter: Payer: Self-pay | Admitting: Cardiology

## 2022-09-08 VITALS — BP 138/80 | HR 71 | Ht 62.0 in | Wt 168.0 lb

## 2022-09-08 DIAGNOSIS — E669 Obesity, unspecified: Secondary | ICD-10-CM

## 2022-09-08 DIAGNOSIS — E785 Hyperlipidemia, unspecified: Secondary | ICD-10-CM | POA: Diagnosis not present

## 2022-09-08 DIAGNOSIS — Z8249 Family history of ischemic heart disease and other diseases of the circulatory system: Secondary | ICD-10-CM

## 2022-09-08 DIAGNOSIS — I10 Essential (primary) hypertension: Secondary | ICD-10-CM

## 2022-09-08 DIAGNOSIS — Z79899 Other long term (current) drug therapy: Secondary | ICD-10-CM | POA: Diagnosis not present

## 2022-09-08 NOTE — Progress Notes (Signed)
Primary Care Provider: Burnard Hawthorne, [facility] Cardiologist: Glenetta Hew, MD Electrophysiologist: None  Clinic Note: Chief Complaint  Patient presents with   Follow-up    F/u calcium score. Meds reviewed verbally with pt.   ===================================  ASSESSMENT/PLAN   Problem List Items Addressed This Visit       Cardiology Problems   Essential hypertension, benign (Chronic)    Relatively stable blood pressures on amlodipine and HCTZ.  With her being treated for prediabetes with metformin, will consider ARB as a next agent if pressures continue to increase.      Relevant Orders   Lipid panel   Hepatic function panel   Hyperlipidemia with target LDL less than 100 (Chronic)    Very reassuring Coronary Calcium Score finding, but still does not show non- calcified plaque.  As such, would still target LDL less than 100.  She wanted to hold off on medical management right now for lipids.  Most to try to give diet and exercise to try.  We will simply reassess lipids prior to 52-month follow-up.  If not at goal at that time, would probably consider low-dose statin.      Relevant Orders   Lipid panel   Hepatic function panel     Other   Family history of premature CAD - Primary (Chronic)    Coronary Calcium Score 0.  Overall great score.  Of course this does not necessarily evaluate for noncalcified plaque which could be present.  With her family history of CAD we will try to shoot for an LDL of at least less than 100. Would also want aggressive risk modifications such as blood pressure and glycemic control.  Low threshold to consider adding ARB for additional blood pressure control.  Low threshold for adding statin       Relevant Orders   Lipid panel   Hepatic function panel   Obesity (BMI 30-39.9)    Continue to work on diet and exercise.  Work on weight loss this will help blood pressure and lipids.      Other Visit Diagnoses      Medication management       Relevant Orders   Lipid panel   Hepatic function panel      ===================================  HPI:    Deanna Schultz is an ~obese 51 y.o. female (NON-SMOKER) mother of 3 with a PMH notable for significant Anxiety/Depression along with HTN, pre-DM-2 and IBS as well as a family history notable for heart disease in her mother and father as well as maternal grandfather (unknown details) who is being seen today for close FOLLOW-UP of  [place] was seen on initial consultation on 06/09/2022 at the request of [place], Yvetta Coder, FNP.  The patient is often asked for cardiology consultation based on her family history of CAD: Father had CAD- CABG in his early 54s; smoker, HTN & HLD Mother had CVA ~33 - did not recover.  CAD prior to that. - smoker, DM, HTN & HLD.   Partly because she is so concerned about her family history of CAD, she is wanted to make adjustments to her lifestyle to reduce her overall risk.  She is making conscious effort to change lifestyle by adjusting her diet and increasing her exercise level.  She exercises ~5 x per week, walks ~50 min, light weights, mows lawn with push mower.   With all of the activities she denies any cardiac symptoms of  chest pain, pressure or dyspnea.  Thankfully, she has not had any irregular heartbeats palpitations.  No syncope/near syncope or TIA/amaurosis fugax.  No claudication.  No heart failure symptoms of PND or orthopnea with only minimal end of day edema.  No claudication. Coronary Calcium Score ordered.  For baseline risk assessment.  Recent Hospitalizations: None   Reviewed  CV studies:    The following studies were reviewed today: (if available, images/films reviewed: From Epic Chart or Care Everywhere) Coronary Calcium Score 06/10/2022: Calcium score 0.  Low low risk overall.  Noncardiac findings: Two 3 mm noncalcified nodules in the right lower lobe and  lingula.  No follow-up needed patient is low risk.  Interval History:   Deanna Schultz presents here today for f/u: She is doing well.  No major issues.  Still doing her exercise routine.  She has not started [facility] but has been able to lose weight.  She is still very active with all of her exercise noted above.  Still not having any real cardiac symptoms.  CV Review of Symptoms (Summary):  no chest pain or dyspnea on exertion positive for - edema and this has been better.   Has lost some wgt & Energy better.  negative for - irregular heartbeat, orthopnea, palpitations, paroxysmal nocturnal dyspnea, rapid heart rate, shortness of breath, or lightheaded/ wooziness, syncope/near syncope, TIA/amaurosis fugax, claudication.  REVIEWED OF SYSTEMS   Review of Systems  Constitutional:  Negative for malaise/fatigue and weight loss.  HENT:  Negative for nosebleeds.   Respiratory:  Negative for cough and shortness of breath.   Cardiovascular:        Per HPI  Gastrointestinal:  Negative for blood in stool, diarrhea and melena.  Genitourinary:  Negative for hematuria.  Musculoskeletal:  Negative for myalgias (mild muscle aches).  Neurological:  Negative for dizziness, seizures, loss of consciousness, weakness and headaches.  Endo/Heme/Allergies:  Negative for environmental allergies.  Psychiatric/Behavioral:  Negative for depression and memory loss. The patient is nervous/anxious (Still somewhat anxious about being in the doctor's office.). The patient does not have insomnia.    I have reviewed and (if needed) personally updated the patient's problem list, medications, allergies, past medical and surgical history, social and family history.   PAST MEDICAL HISTORY   Past Medical History:  Diagnosis Date   Anxiety    Depression    GERD (gastroesophageal reflux disease)    Hypertension    IBS (irritable bowel syndrome)    Placenta previa     PAST SURGICAL HISTORY   Past Surgical History:   Procedure Laterality Date   CESAREAN SECTION     x 1    DILATION AND CURETTAGE OF UTERUS  06/2002   due to miscarriage   VAGINAL DELIVERY     x 2    Immunization History  Administered Date(s) Administered   Influenza,inj,Quad PF,6+ Mos 08/29/2013, 08/13/2015, 08/09/2019, 07/25/2020, 10/03/2021   Influenza-Unspecified 09/10/2014   Moderna Sars-Covid-2 Vaccination 02/14/2020, 03/17/2020   PFIZER(Purple Top)SARS-COV-2 Vaccination 11/06/2020   Pfizer Covid-19 Vaccine Bivalent Booster 23yrs & up 10/03/2021   Tdap 06/19/2019   Zoster Recombinat (Shingrix) 03/07/2021, 11/09/2021    MEDICATIONS/ALLERGIES   Current Meds  Medication Sig   amLODipine (NORVASC) 5 MG tablet TAKE 1 TABLET BY MOUTH EVERY DAY   hydrochlorothiazide (MICROZIDE) 12.5 MG capsule TAKE 1 CAPSULE BY MOUTH EVERY DAY   hydrOXYzine (ATARAX) 10 MG tablet TAKE 1 TABLET (10 MG TOTAL) BY MOUTH TWICE A DAY AS NEEDED FOR ANXIETY   Rolan Lipa  145 MCG CAPS capsule TAKE 1 CAPSULE BY MOUTH EVERY DAY   meloxicam (MOBIC) 7.5 MG tablet Take 1 tablet (7.5 mg total) by mouth daily as needed for pain.   metFORMIN (GLUCOPHAGE-XR) 500 MG 24 hr tablet TAKE TWO TABLETS 1000 MG EVERY MORNING AND TWO TABLETS 1000 MG IN THE EVENING WITH MEALS.   Omega-3 Fatty Acids (FISH OIL PO) Take by mouth.   Omeprazole-Sodium Bicarbonate (ZEGERID) 20-1100 MG CAPS Take 1 capsule by mouth daily before breakfast.   sertraline (ZOLOFT) 100 MG tablet TAKE 1 TABLET BY MOUTH EVERYDAY AT BEDTIME    No Known Allergies  SOCIAL HISTORY/FAMILY HISTORY   Reviewed in Epic:   Social History   Tobacco Use   Smoking status: Never   Smokeless tobacco: Never  Vaping Use   Vaping Use: Never used  Substance Use Topics   Alcohol use: Yes    Alcohol/week: 5.0 standard drinks of alcohol    Types: 5 Standard drinks or equivalent per week    Comment: 5 DRINKS A WEEK   Drug use: No   Social History   Social History Narrative   Lives in [place] with husband and 3  children, 2 boys and one girl  who is the youngest (55)       Marina Gravel, Dog in house. Homemaker.      Regular Exercise -  YES, 5 days a week   Daily Caffeine Use:  4 cups coffee         Family History  Problem Relation Age of Onset   Hyperlipidemia Mother    Hypertension Mother    Diabetes Mother    Colon polyps Mother    Heart disease Mother    Hypertension Father    Heart disease Father    Hyperlipidemia Father    Arthritis Father    Colon polyps Father    Cancer Brother 16       Brain Tumor, GBM   Hypertension Brother    Heart disease Maternal Grandfather    Colon cancer Neg Hx    Esophageal cancer Neg Hx    Stomach cancer Neg Hx    Rectal cancer Neg Hx    Thyroid cancer Neg Hx     OBJCTIVE -PE, EKG, labs   Wt Readings from Last 3 Encounters:  09/08/22 168 lb (76.2 kg)  08/10/22 169 lb 12.8 oz (77 kg)  06/09/22 174 lb 3.2 oz (79 kg)    Physical Exam: BP 138/80 (BP Location: Left Arm, Patient Position: Sitting, Cuff Size: Normal)   Pulse 71   Ht 5\' 2"  (1.575 m)   Wt 168 lb (76.2 kg)   SpO2 98%   BMI 30.73 kg/m  Physical Exam Vitals reviewed.  Constitutional:      General: She is not in acute distress.    Appearance: Normal appearance. She is obese. She is not ill-appearing or toxic-appearing.  HENT:     Head: Normocephalic and atraumatic.  Neck:     Vascular: No carotid bruit.  Cardiovascular:     Rate and Rhythm: Normal rate and regular rhythm.     Pulses: Normal pulses.     Heart sounds: Normal heart sounds. No murmur heard.    No friction rub. No gallop.  Pulmonary:     Effort: Pulmonary effort is normal. No respiratory distress.     Breath sounds: Normal breath sounds. No wheezing, rhonchi or rales.  Chest:     Chest wall: No tenderness.  Musculoskeletal:     Cervical  back: Normal range of motion and neck supple.  Neurological:     General: No focal deficit present.     Mental Status: She is alert and oriented to person, place, and time. Mental  status is at baseline.     Cranial Nerves: No cranial nerve deficit.     Motor: No weakness.     Gait: Gait normal.  Psychiatric:        Mood and Affect: Mood normal.        Behavior: Behavior normal.        Thought Content: Thought content normal.        Judgment: Judgment normal.     Adult ECG Report Not checked  Recent Labs: Reviewed. Lab Results  Component Value Date   CHOL 260 (H) 05/03/2022   HDL 74.80 05/03/2022   LDLCALC 162 (H) 05/03/2022   TRIG 118.0 05/03/2022   CHOLHDL 3 05/03/2022   Lab Results  Component Value Date   CREATININE 0.66 05/03/2022   BUN 19 05/03/2022   NA 138 05/03/2022   K 3.6 05/03/2022   CL 101 05/03/2022   CO2 26 05/03/2022      Latest Ref Rng & Units 05/03/2022    8:34 AM 06/21/2021   11:21 AM 06/23/2020    9:43 AM  CBC  WBC 4.0 - 10.5 K/uL 5.6  7.1  5.4   Hemoglobin 12.0 - 15.0 g/dL 13.8  13.5  14.0   Hematocrit 36.0 - 46.0 % 41.0  40.4  41.5   Platelets 150.0 - 400.0 K/uL 231.0  231.0  195.0    - ON Metformin for Wgt.  Lab Results  Component Value Date   HGBA1C 5.6 05/03/2022   Lab Results  Component Value Date   TSH 0.89 05/03/2022    ================================================== I spent a total of 15 minutes with the patient spent in direct patient consultation.  Additional time spent with chart review  / charting (studies, outside notes, etc): 17 min Total Time: 32 min  Current medicines are reviewed at length with the patient today.  (+/- concerns) none  Notice: This dictation was prepared with Dragon dictation along with smart phrase technology. Any transcriptional errors that result from this process are unintentional and may not be corrected upon review.   Studies Ordered:  Orders Placed This Encounter  Procedures   Lipid panel   Hepatic function panel   No orders of the defined types were placed in this encounter.   Patient Instructions / Medication Changes & Studies & Tests Ordered   Patient  Instructions  Medication Instructions:  Your physician recommends that you continue on your current medications as directed. Please refer to the Current Medication list given to you today. *If you need a refill on your cardiac medications before your next appointment, please call your pharmacy*   Lab Work: Your physician recommends that you return for a FASTING lipid & liver profile in December 2023. Please do not eat or drink for at least 8 hours when you have this done. You may take your medications that morning with a sip of water. Medical Mall Entrance at [facility] 1st desk on the right to check in (REGISTRATION)  Lab hours: Monday- Friday (7:30 am- 5:30 pm) If you have labs (blood work) drawn today and your tests are completely normal, you will receive your results only by: [place] (if you have MyChart) OR A paper copy in the mail If you have any lab test that is abnormal or we need  to change your treatment, we will call you to review the results.   Testing/Procedures: none   Follow-Up: At [facility], you and your health needs are our priority.  As part of our continuing mission to provide you with exceptional heart care, we have created designated Provider Care Teams.  These Care Teams include your primary Cardiologist (physician) and Advanced Practice Providers (APPs -  Physician Assistants and Nurse Practitioners) who all work together to provide you with the care you need, when you need it.  We recommend signing up for the patient portal called "MyChart".  Sign up information is provided on this After Visit Summary.  MyChart is used to connect with patients for Virtual Visits (Telemedicine).  Patients are able to view lab/test results, encounter notes, upcoming appointments, etc.  Non-urgent messages can be sent to your provider as well.   To learn more about what you can do with MyChart, go to [email or web address].    Your next appointment:   3 month(s)  The  format for your next appointment:   In Person  Provider:   You may see Bryan Lemma, MD or one of the following Advanced Practice Providers on your designated Care Team:   Nicolasa Ducking, NP Eula Listen, PA-C Cadence Fransico Michael, PA-C Charlsie Quest, NP    Other Instructions N/A  Important Information About Sugar          Bryan Lemma, M.D., M.S. Interventional Cardiologist   Pager # [phone] Phone # [phone] [address] [place], [place] [address]   Thank you for choosing [facility] [place]!!

## 2022-09-08 NOTE — Patient Instructions (Signed)
Medication Instructions:  Your physician recommends that you continue on your current medications as directed. Please refer to the Current Medication list given to you today. *If you need a refill on your cardiac medications before your next appointment, please call your pharmacy*   Lab Work: Your physician recommends that you return for a FASTING lipid & liver profile in December 2023. Please do not eat or drink for at least 8 hours when you have this done. You may take your medications that morning with a sip of water. Medical Mall Entrance at [facility] 1st desk on the right to check in (REGISTRATION)  Lab hours: Monday- Friday (7:30 am- 5:30 pm) If you have labs (blood work) drawn today and your tests are completely normal, you will receive your results only by: [place] (if you have MyChart) OR A paper copy in the mail If you have any lab test that is abnormal or we need to change your treatment, we will call you to review the results.   Testing/Procedures: none   Follow-Up: At [facility], you and your health needs are our priority.  As part of our continuing mission to provide you with exceptional heart care, we have created designated Provider Care Teams.  These Care Teams include your primary Cardiologist (physician) and Advanced Practice Providers (APPs -  Physician Assistants and Nurse Practitioners) who all work together to provide you with the care you need, when you need it.  We recommend signing up for the patient portal called "MyChart".  Sign up information is provided on this After Visit Summary.  MyChart is used to connect with patients for Virtual Visits (Telemedicine).  Patients are able to view lab/test results, encounter notes, upcoming appointments, etc.  Non-urgent messages can be sent to your provider as well.   To learn more about what you can do with MyChart, go to [email or web address].    Your next appointment:   3 month(s)  The format for your  next appointment:   In Person  Provider:   You may see Glenetta Hew, MD or one of the following Advanced Practice Providers on your designated Care Team:   Murray Hodgkins, NP Christell Faith, PA-C Cadence Kathlen Mody, PA-C Gerrie Nordmann, NP    Other Instructions N/A  Important Information About Sugar

## 2022-09-15 ENCOUNTER — Other Ambulatory Visit: Payer: Self-pay | Admitting: Family

## 2022-09-15 DIAGNOSIS — F411 Generalized anxiety disorder: Secondary | ICD-10-CM

## 2022-09-21 ENCOUNTER — Encounter: Payer: Self-pay | Admitting: Family

## 2022-09-21 ENCOUNTER — Ambulatory Visit: Payer: Managed Care, Other (non HMO) | Admitting: Family

## 2022-09-21 NOTE — Telephone Encounter (Signed)
Patient states she is returning a call from Jenate [place], [place].  I was unable to reach [place] at the time of the call so I let patient know that I will send her a message to let her know she called.

## 2022-09-21 NOTE — Telephone Encounter (Signed)
LVM to call back to office  

## 2022-09-22 ENCOUNTER — Other Ambulatory Visit: Payer: Self-pay

## 2022-09-22 DIAGNOSIS — R200 Anesthesia of skin: Secondary | ICD-10-CM

## 2022-09-22 NOTE — Telephone Encounter (Signed)
Referral placed pt is notified

## 2022-09-22 NOTE — Progress Notes (Signed)
Amb ortho  

## 2022-10-02 ENCOUNTER — Encounter: Payer: Self-pay | Admitting: Cardiology

## 2022-10-02 NOTE — Assessment & Plan Note (Addendum)
Coronary Calcium Score 0.  Overall great score.  Of course this does not necessarily evaluate for noncalcified plaque which could be present.  With her family history of CAD we will try to shoot for an LDL of at least less than 100. Would also want aggressive risk modifications such as blood pressure and glycemic control.  Low threshold to consider adding ARB for additional blood pressure control.  Low threshold for adding statin

## 2022-10-02 NOTE — Assessment & Plan Note (Signed)
Relatively stable blood pressures on amlodipine and HCTZ.  With her being treated for prediabetes with metformin, will consider ARB as a next agent if pressures continue to increase.

## 2022-10-02 NOTE — Assessment & Plan Note (Signed)
Very reassuring Coronary Calcium Score finding, but still does not show non- calcified plaque.  As such, would still target LDL less than 100.  She wanted to hold off on medical management right now for lipids.  Most to try to give diet and exercise to try.  We will simply reassess lipids prior to 5-month follow-up.  If not at goal at that time, would probably consider low-dose statin.

## 2022-10-02 NOTE — Assessment & Plan Note (Signed)
Continue to work on diet and exercise.  Work on weight loss this will help blood pressure and lipids.

## 2022-10-03 ENCOUNTER — Encounter: Payer: Self-pay | Admitting: Family

## 2022-10-03 NOTE — Addendum Note (Signed)
Addended by: Martin[place], Caitlen Worth on: 10/03/2022 12:00 PM   Modules accepted: Orders

## 2022-10-07 ENCOUNTER — Other Ambulatory Visit: Payer: Self-pay | Admitting: Family

## 2022-10-07 DIAGNOSIS — G6289 Other specified polyneuropathies: Secondary | ICD-10-CM

## 2022-11-06 ENCOUNTER — Other Ambulatory Visit: Payer: Self-pay | Admitting: Family

## 2022-11-06 DIAGNOSIS — E669 Obesity, unspecified: Secondary | ICD-10-CM

## 2022-11-08 ENCOUNTER — Other Ambulatory Visit
Admission: RE | Admit: 2022-11-08 | Discharge: 2022-11-08 | Disposition: A | Discharge status: Home / Self Care | Payer: Managed Care, Other (non HMO) | Attending: Cardiology | Admitting: Cardiology

## 2022-11-08 DIAGNOSIS — I10 Essential (primary) hypertension: Secondary | ICD-10-CM | POA: Diagnosis present

## 2022-11-08 DIAGNOSIS — Z79899 Other long term (current) drug therapy: Secondary | ICD-10-CM | POA: Diagnosis present

## 2022-11-08 DIAGNOSIS — E785 Hyperlipidemia, unspecified: Secondary | ICD-10-CM | POA: Insufficient documentation

## 2022-11-08 DIAGNOSIS — Z8249 Family history of ischemic heart disease and other diseases of the circulatory system: Secondary | ICD-10-CM | POA: Insufficient documentation

## 2022-11-08 LAB — HEPATIC FUNCTION PANEL
ALT: 24 U/L (ref 0–44)
AST: 27 U/L (ref 15–41)
Albumin: 4.7 g/dL (ref 3.5–5.0)
Alkaline Phosphatase: 55 U/L (ref 38–126)
Bilirubin, Direct: <0.1 mg/dL (ref 0.0–0.2)
Total Bilirubin: 0.6 mg/dL (ref 0.3–1.2)
Total Protein: 8.5 g/dL — ABNORMAL HIGH (ref 6.5–8.1)

## 2022-11-08 LAB — LIPID PANEL
Cholesterol: 323 mg/dL — ABNORMAL HIGH (ref 0–200)
HDL: 90 mg/dL (ref >40)
LDL Cholesterol: 193 mg/dL — ABNORMAL HIGH (ref 0–99)
Total CHOL/HDL Ratio: 3.6 RATIO
Triglycerides: 198 mg/dL — ABNORMAL HIGH (ref <150)
VLDL: 40 mg/dL (ref 0–40)

## 2022-11-10 ENCOUNTER — Encounter: Payer: Self-pay | Admitting: Family

## 2022-11-10 ENCOUNTER — Other Ambulatory Visit: Payer: Self-pay | Admitting: Family

## 2022-11-10 NOTE — Telephone Encounter (Signed)
Spoke to pt and informed her that she has an appt @ [facility] on 11/29/22 @ 1:30 pm

## 2022-11-18 ENCOUNTER — Encounter: Payer: Self-pay | Admitting: Family

## 2022-11-18 DIAGNOSIS — E669 Obesity, unspecified: Secondary | ICD-10-CM

## 2022-11-18 MED ORDER — METFORMIN HCL ER 500 MG PO TB24: 1000.0000 mg | ORAL_TABLET | Freq: Two times a day (BID) | ORAL | 0 refills | Status: DC

## 2022-12-09 ENCOUNTER — Encounter: Payer: Self-pay | Admitting: Nurse Practitioner

## 2022-12-09 ENCOUNTER — Ambulatory Visit: Payer: Managed Care, Other (non HMO) | Attending: Nurse Practitioner | Admitting: Nurse Practitioner

## 2022-12-09 VITALS — BP 127/80 | HR 70 | Ht 62.0 in | Wt 172.2 lb

## 2022-12-09 DIAGNOSIS — Z79899 Other long term (current) drug therapy: Secondary | ICD-10-CM | POA: Diagnosis not present

## 2022-12-09 DIAGNOSIS — I10 Essential (primary) hypertension: Secondary | ICD-10-CM

## 2022-12-09 DIAGNOSIS — E669 Obesity, unspecified: Secondary | ICD-10-CM

## 2022-12-09 DIAGNOSIS — E785 Hyperlipidemia, unspecified: Secondary | ICD-10-CM | POA: Diagnosis not present

## 2022-12-09 DIAGNOSIS — Z8249 Family history of ischemic heart disease and other diseases of the circulatory system: Secondary | ICD-10-CM

## 2022-12-09 MED ORDER — ROSUVASTATIN CALCIUM 20 MG PO TABS: ORAL_TABLET | ORAL | 3 refills | Status: DC

## 2022-12-09 NOTE — Progress Notes (Signed)
Office Visit    Patient Name: Deanna Schultz Date of Encounter: 12/09/2022  Primary Care Provider:  Burnard Hawthorne, FNP Primary Cardiologist:  Deanna Hew, MD  Chief Complaint    52 year old female with a family history of CAD, hypertension, hyperlipidemia, obesity, anxiety, irritable bowel syndrome, and lung nodules, who presents for follow-up related to hyperlipidemia.  Past Medical History    Past Medical History:  Diagnosis Date   Anxiety    Family history of premature CAD    a. 05/2022 Cardiac CT: Ca2+ = 0.   GERD (gastroesophageal reflux disease)    Hypertension    IBS (irritable bowel syndrome)    Lung nodules    a. 05/2022 CT Chest: 2, 18mm noncalcified nodules in the right lower lobe and lingula.   Mixed hyperlipidemia    Morbid obesity (Deanna Schultz)    Placenta previa    Past Surgical History:  Procedure Laterality Date   CESAREAN SECTION     x 1    DILATION AND CURETTAGE OF UTERUS  06/2002   due to miscarriage   VAGINAL DELIVERY     x 2    Allergies  No Known Allergies  History of Present Illness    52 year old female with above past medical history including family history of CAD, hypertension, hyperlipidemia, obesity, anxiety, irritable bowel syndrome, and incidental finding of lung nodules.  She previously established with Dr. Ellyn Schultz for risk stratification in the setting of her father having CABG in her early 51s, and mother having a stroke in her early 50s.  Coronary calcium scoring was performed in July 2023 with a calcium score of 0.  She was instantly noted to have 2, 3 mm noncalcified nodules in the right lower lobe and lingula with recommendation for follow-up only if she were high risk.  She is a lifelong non-smoker.  Ms. Deanna Schultz was last in cardiology clinic in October 2023, at which time she was doing well.  She has continued to do well and exercises 4 times a week-walking for up to an hour.  She does not experience chest pain or dyspnea.  She  tried a keto diet but actually gained a few pounds and her lipids worsened.  She had lipids on December 12 with a total cholesterol 323, HDL of 90, LDL of 193, triglycerides 198.  She is interested in pursuing statin therapy.  She denies palpitations, PND, orthopnea, dizziness, syncope, edema, or early satiety.  Home Medications    Current Outpatient Medications  Medication Sig Dispense Refill   amLODipine (NORVASC) 5 MG tablet TAKE 1 TABLET BY MOUTH EVERY DAY 90 tablet 2   b complex vitamins capsule Take 1 capsule by mouth in the morning and at bedtime.     hydrochlorothiazide (MICROZIDE) 12.5 MG capsule TAKE 1 CAPSULE BY MOUTH EVERY DAY 90 capsule 1   hydrOXYzine (ATARAX) 10 MG tablet TAKE 1 TABLET (10 MG TOTAL) BY MOUTH TWICE A DAY AS NEEDED FOR ANXIETY 180 tablet 1   LINZESS 145 MCG CAPS capsule TAKE 1 CAPSULE BY MOUTH EVERY DAY 90 capsule 3   meloxicam (MOBIC) 7.5 MG tablet TAKE 1 TABLET BY MOUTH DAILY AS NEEDED FOR PAIN 30 tablet 1   metFORMIN (GLUCOPHAGE-XR) 500 MG 24 hr tablet Take 2 tablets (1,000 mg total) by mouth 2 (two) times daily with a meal. 336 tablet 0   Omega-3 Fatty Acids (FISH OIL PO) Take by mouth.     Omeprazole-Sodium Bicarbonate (ZEGERID) 20-1100 MG CAPS Take 1 capsule  by mouth daily before breakfast. 28 each 3   sertraline (ZOLOFT) 100 MG tablet TAKE 1 TABLET BY MOUTH EVERYDAY AT BEDTIME 90 tablet 2   No current facility-administered medications for this visit.     Review of Systems    She denies chest pain, palpitations, dyspnea, pnd, orthopnea, n, v, dizziness, syncope, edema, weight gain, or early satiety.  All other systems reviewed and are otherwise negative except as noted above.    Physical Exam    VS:  BP 127/80 (BP Location: Left Arm, Patient Position: Sitting, Cuff Size: Normal)   Pulse 70   Ht 5\' 2"  (1.575 m)   Wt 172 lb 4 oz (78.1 kg)   SpO2 97%   BMI 31.50 kg/m  , BMI Body mass index is 31.5 kg/m.     GEN: Well nourished, well developed, in  no acute distress. HEENT: normal. Neck: Supple, no JVD, carotid bruits, or masses. Cardiac: RRR, no murmurs, rubs, or gallops. No clubbing, cyanosis, edema.  Radials 2+/PT 2+ and equal bilaterally.  Respiratory:  Respirations regular and unlabored, clear to auscultation bilaterally. GI: Soft, nontender, nondistended, BS + x 4. MS: no deformity or atrophy. Skin: warm and dry, no rash. Neuro:  Strength and sensation are intact. Psych: Normal affect.  Accessory Clinical Findings    Lab Results  Component Value Date   WBC 5.6 05/03/2022   HGB 13.8 05/03/2022   HCT 41.0 05/03/2022   MCV 95.1 05/03/2022   PLT 231.0 05/03/2022   Lab Results  Component Value Date   CREATININE 0.66 05/03/2022   BUN 19 05/03/2022   NA 138 05/03/2022   K 3.6 05/03/2022   CL 101 05/03/2022   CO2 26 05/03/2022   Lab Results  Component Value Date   ALT 24 11/08/2022   AST 27 11/08/2022   ALKPHOS 55 11/08/2022   BILITOT 0.6 11/08/2022   Lab Results  Component Value Date   CHOL 323 (H) 11/08/2022   HDL 90 11/08/2022   LDLCALC 193 (H) 11/08/2022   LDLDIRECT 124.2 12/05/2013   TRIG 198 (H) 11/08/2022   CHOLHDL 3.6 11/08/2022    Lab Results  Component Value Date   HGBA1C 5.6 05/03/2022    Assessment & Plan    1.  Family history of coronary artery disease: Cardiac CT in July 2023 showed a calcium score of 0.  She is active, exercising 4 days a week without symptoms or limitations.  We discussed the importance of a healthy diet, primarily whole food and plant-based if possible.  We also discussed her lipids today-see below.  Initiating rosuvastatin 20 mg daily in the setting of a total cholesterol of 323 with an LDL of 193.  2.  Essential hypertension: Stable on calcium channel blocker and HCTZ therapy.  3.  Hyperlipidemia: Total cholesterol 323, triglycerides 198, HDL 90, LDL of 193.  Long discussion regarding lifelong exposure to markedly elevated lipids and risks for developing coronary artery  disease.  Based on [facility] guidelines, she meets criteria for initiation of statin therapy with an LDL greater than 190.  I am adding rosuvastatin 20 mg daily.  If tolerated, could consider titrating further.  Plan for follow-up lipids and LFTs in 6 to 8 weeks.  4.  Morbid obesity: She tried a keto diet however actually gained a little bit of weight and her lipids worsened.  Discussed the importance of regular activity and caloric restriction.  Asked her to consider moving towards a more plant-based whole food diet with meat  restrictions given significant hyperlipidemia.  She is currently taking metformin for weight loss.  Her insurance previously denied GLP-1 agonist therapy.  5.  Lung nodules: Incidentally noted on chest CT at the time of cardiac CT.  Lifelong non-smoker and therefore low risk.  Per radiology, no follow-up required.  6.  Disposition: Follow-up lipids and LFTs in 6 to 8 weeks.  Follow-up in clinic in 6 months or sooner if necessary.   Nicolasa Ducking, NP 12/09/2022, 9:07 AM

## 2022-12-09 NOTE — Patient Instructions (Addendum)
Medication Instructions:  - Your physician has recommended you make the following change in your medication:   1) STOP Omega 3  2) START Crestor (Rosuvastatin) 20 mg: - take 1 tablet by mouth once daily   *If you need a refill on your cardiac medications before your next appointment, please call your pharmacy*   Lab Work: - Your physician recommends that you:  Return for lab work in: 6-8 weeks (around 01/20/23-02/03/23)  [facility]atter Entrance at [facility] 1st desk on the right to check in (REGISTRATION)  Lab hours: Monday- Friday (7:30 am- 5:30 pm)   If you have labs (blood work) drawn today and your tests are completely normal, you will receive your results only by: MyChart Message (if you have MyChart) OR A paper copy in the mail If you have any lab test that is abnormal or we need to change your treatment, we will call you to review the results.   Testing/Procedures: - none ordered   Follow-Up: At [facility], you and your health needs are our priority.  As part of our continuing mission to provide you with exceptional heart care, we have created designated Provider Care Teams.  These Care Teams include your primary Cardiologist (physician) and Advanced Practice Providers (APPs -  Physician Assistants and Nurse Practitioners) who all work together to provide you with the care you need, when you need it.  We recommend signing up for the patient portal called "MyChart".  Sign up information is provided on this After Visit Summary.  MyChart is used to connect with patients for Virtual Visits (Telemedicine).  Patients are able to view lab/test results, encounter notes, upcoming appointments, etc.  Non-urgent messages can be sent to your provider as well.   To learn more about what you can do with MyChart, go to [email or web address].    Your next appointment:   6 month(s)  Provider:   You may see Glenetta Hew, MD or one of the following Advanced  Practice Providers on your designated Care Team:   Murray Hodgkins, NP     Other Instructions  Rosuvastatin Tablets What is this medication? ROSUVASTATIN (roe SOO va sta tin) treats high cholesterol and reduces the risk of heart attack and stroke. It works by decreasing bad cholesterol and fats (such as LDL, triglycerides) and increasing good cholesterol (HDL) in your blood. It belongs to a group of medications called statins. Changes to diet and exercise are often combined with this medication. This medicine may be used for other purposes; ask your health care provider or pharmacist if you have questions. COMMON BRAND NAME(S): Crestor What should I tell my care team before I take this medication? They need to know if you have any of these conditions: Diabetes Frequently drink alcohol Kidney disease Liver disease Muscle cramps, pain Stroke Thyroid disease An unusual or allergic reaction to rosuvastatin, other medications, foods, dyes, or preservatives Pregnant or trying to get pregnant Breastfeeding How should I use this medication? Take this medication by mouth with a glass of water. Follow the directions on the prescription label. You can take it with or without food. If it upsets your stomach, take it with food. Do not cut, crush or chew this medication. Swallow the tablets whole. Take your medication at regular intervals. Do not take it more often than directed. Take antacids that have a combination of aluminum and magnesium hydroxide in them at a different time of day than this medication. Take these products 2 hours  AFTER this medication. Talk to your care team about the use of this medication in children. While this medication may be prescribed for children as young as 7 for selected conditions, precautions do apply. Overdosage: If you think you have taken too much of this medicine contact a poison control center or emergency room at once. NOTE: This medicine is only for you. Do  not share this medicine with others. What if I miss a dose? If you miss a dose, take it as soon as you can. If your next dose is to be taken in less than 12 hours, then do not take the missed dose. Take the next dose at your regular time. Do not take double or extra doses. What may interact with this medication? Do not take this medication with any of the following: Supplements like red yeast rice This medication may also interact with the following: Alcohol Antacids containing aluminum hydroxide and magnesium hydroxide Cyclosporine Other medications for high cholesterol Some medications for HIV infection Warfarin This list may not describe all possible interactions. Give your health care provider a list of all the medicines, herbs, non-prescription drugs, or dietary supplements you use. Also tell them if you smoke, drink alcohol, or use illegal drugs. Some items may interact with your medicine. What should I watch for while using this medication? Visit your care team for regular checks on your progress. Tell your care team if your symptoms do not start to get better or if they get worse. Your care team may tell you to stop taking this medication if you develop muscle problems. If your muscle problems do not go away after stopping this medication, contact your care team. Talk to your care team if you may be pregnant. Serious birth defects can occur if you take this medication during pregnancy. Talk to your care team before breastfeeding. Changes to your treatment plan may be needed. This medication may increase blood sugar. Ask your care team if changes in diet or medications are needed if you have diabetes. If you are going to need surgery or other procedure, tell your care team that you are using this medication. Taking this medication is only part of a total heart healthy program. Ask your care team if there are other changes you can make to improve your overall health. What side effects may  I notice from receiving this medication? Side effects that you should report to your care team as soon as possible: Allergic reactions--skin rash, itching, hives, swelling of the face, lips, tongue, or throat High blood sugar (hyperglycemia)--increased thirst or amount of urine, unusual weakness, fatigue, blurry vision Liver injury--right upper belly pain, loss of appetite, nausea, light-colored stool, dark yellow or brown urine, yellowing skin or eyes, unusual weakness, fatigue Muscle injury--unusual weakness, fatigue, muscle pain, dark yellow or brown urine, decrease in amount of urine Redness, blistering, peeling or loosening of the skin, including inside the mouth Side effects that usually do not require medical attention (report to your care team if they continue or are bothersome): Fatigue Headache Nausea Stomach pain This list may not describe all possible side effects. Call your doctor for medical advice about side effects. You may report side effects to FDA at 1-800-FDA-1088. Where should I keep my medication? Keep out of the reach of children and pets. Store between 20 and 25 degrees C (68 and 77 degrees F). Get rid of any unused medication after the expiration date. To get rid of medications that are no longer needed or have  expired: Take the medication to a medication take-back program. Check with your pharmacy or law enforcement to find a location. If you cannot return the medication, check the label or package insert to see if the medication should be thrown out in the garbage or flushed down the toilet. If you are not sure, ask your care team. If it is safe to put it in the trash, take the medication out of the container. Mix the medication with cat litter, dirt, coffee grounds, or other unwanted substance. Seal the mixture in a bag or container. Put it in the trash. NOTE: This sheet is a summary. It may not cover all possible information. If you have questions about this medicine,  talk to your doctor, pharmacist, or health care provider.  2023 Elsevier/Gold Standard (2020-12-11 00:00:00)

## 2023-01-31 ENCOUNTER — Other Ambulatory Visit
Admission: RE | Admit: 2023-01-31 | Discharge: 2023-01-31 | Disposition: A | Discharge status: Home / Self Care | Payer: Managed Care, Other (non HMO) | Source: Ambulatory Visit | Attending: Nurse Practitioner | Admitting: Nurse Practitioner

## 2023-01-31 DIAGNOSIS — E785 Hyperlipidemia, unspecified: Secondary | ICD-10-CM | POA: Diagnosis present

## 2023-01-31 DIAGNOSIS — Z79899 Other long term (current) drug therapy: Secondary | ICD-10-CM | POA: Diagnosis present

## 2023-01-31 DIAGNOSIS — Z8249 Family history of ischemic heart disease and other diseases of the circulatory system: Secondary | ICD-10-CM | POA: Diagnosis present

## 2023-01-31 LAB — HEPATIC FUNCTION PANEL
ALT: 29 U/L (ref 0–44)
AST: 33 U/L (ref 15–41)
Albumin: 4.4 g/dL (ref 3.5–5.0)
Alkaline Phosphatase: 53 U/L (ref 38–126)
Bilirubin, Direct: <0.1 mg/dL (ref 0.0–0.2)
Total Bilirubin: 0.6 mg/dL (ref 0.3–1.2)
Total Protein: 7.9 g/dL (ref 6.5–8.1)

## 2023-01-31 LAB — LIPID PANEL
Cholesterol: 157 mg/dL (ref 0–200)
HDL: 81 mg/dL (ref >40)
LDL Cholesterol: 60 mg/dL (ref 0–99)
Total CHOL/HDL Ratio: 1.9 RATIO
Triglycerides: 78 mg/dL (ref <150)
VLDL: 16 mg/dL (ref 0–40)

## 2023-02-05 ENCOUNTER — Other Ambulatory Visit: Payer: Self-pay | Admitting: Family

## 2023-02-08 ENCOUNTER — Other Ambulatory Visit: Payer: Self-pay | Admitting: Internal Medicine

## 2023-02-08 DIAGNOSIS — E669 Obesity, unspecified: Secondary | ICD-10-CM

## 2023-02-28 ENCOUNTER — Encounter: Payer: Self-pay | Admitting: Family

## 2023-02-28 ENCOUNTER — Ambulatory Visit: Payer: Managed Care, Other (non HMO) | Admitting: Family

## 2023-02-28 VITALS — BP 128/80 | HR 78 | Temp 97.9°F | Ht 62.5 in | Wt 175.2 lb

## 2023-02-28 DIAGNOSIS — I10 Essential (primary) hypertension: Secondary | ICD-10-CM

## 2023-02-28 DIAGNOSIS — Z1231 Encounter for screening mammogram for malignant neoplasm of breast: Secondary | ICD-10-CM

## 2023-02-28 DIAGNOSIS — F411 Generalized anxiety disorder: Secondary | ICD-10-CM

## 2023-02-28 MED ORDER — BUPROPION HCL ER (XL) 150 MG PO TB24: ORAL_TABLET | ORAL | 3 refills | Status: DC

## 2023-02-28 MED ORDER — SERTRALINE HCL 50 MG PO TABS: 50.0000 mg | ORAL_TABLET | Freq: Every day | ORAL | 3 refills | Status: DC

## 2023-02-28 NOTE — Progress Notes (Signed)
Assessment & Plan:  Encounter for screening mammogram for malignant neoplasm of breast -     3D Screening Mammogram, Left and Right; Future  GAD (generalized anxiety disorder) Assessment & Plan: Concern for weight gain or difficulty losing weight on Zoloft.  Decrease Zoloft 50 mg daily.  Patient will start Wellbutrin 150 mg and increase to 300 mg every morning after 3 to 7 days.  Orders: -     buPROPion HCl ER (XL); Start 150 mg ER PO qam, increase after 3 days to 300 mg qam.  Dispense: 180 tablet; Refill: 3 -     Sertraline HCl; Take 1 tablet (50 mg total) by mouth at bedtime.  Dispense: 90 tablet; Refill: 3  Essential hypertension, benign Assessment & Plan: Chronic, stable.  Continue hydrochlorothiazide 12 mg, amlodipine 5 mg      Return precautions given.   Risks, benefits, and alternatives of the medications and treatment plan prescribed today were discussed, and patient expressed understanding.   Education regarding symptom management and diagnosis given to patient on AVS either electronically or printed.  Return in about 4 months (around 06/30/2023).  Mable Paris, FNP  Subjective:    Patient ID: Deanna Schultz, female    DOB: 1971/08/15, 52 y.o.   MRN: [id number]  CC: Deanna Schultz is a 52 y.o. female who presents today for follow up.   HPI: Complains of increased anxiety and feeling overwhelmed as she plans to move to [place] later this fall. She is also frustrated by weight gain and difficulty losing weight.  She endorses overeating when she is stressed.  She endorses dietary indiscretion as she works part-time for [employer]. No history of anorexia, bulimia, seizure.  No heavy alcohol use  Insurance company did not cover [employer].  She has used phentermine in the past    Allergies: Patient has no known allergies. Current Outpatient Medications on File Prior to Visit  Medication Sig Dispense Refill   amLODipine (NORVASC) 5 MG tablet  TAKE 1 TABLET BY MOUTH EVERY DAY 90 tablet 2   b complex vitamins capsule Take 1 capsule by mouth in the morning and at bedtime.     hydrochlorothiazide (MICROZIDE) 12.5 MG capsule TAKE 1 CAPSULE BY MOUTH EVERY DAY 90 capsule 1   hydrOXYzine (ATARAX) 10 MG tablet TAKE 1 TABLET (10 MG TOTAL) BY MOUTH TWICE A DAY AS NEEDED FOR ANXIETY 180 tablet 1   LINZESS 145 MCG CAPS capsule TAKE 1 CAPSULE BY MOUTH EVERY DAY 90 capsule 3   meloxicam (MOBIC) 7.5 MG tablet TAKE 1 TABLET BY MOUTH DAILY AS NEEDED FOR PAIN 30 tablet 1   Omeprazole-Sodium Bicarbonate (ZEGERID) 20-1100 MG CAPS Take 1 capsule by mouth daily before breakfast. 28 each 3   rosuvastatin (CRESTOR) 20 MG tablet Take 1 tablet (20 mg) by mouth once daily 90 tablet 3   No current facility-administered medications on file prior to visit.    Review of Systems  Constitutional:  Negative for chills and fever.  Respiratory:  Negative for cough.   Cardiovascular:  Negative for chest pain and palpitations.  Gastrointestinal:  Negative for nausea and vomiting.      Objective:    BP 128/80   Pulse 78   Temp 97.9 F (36.6 C) (Oral)   Ht 5' 2.5" (1.588 m)   Wt 175 lb 3.2 oz (79.5 kg)   LMP  (LMP Unknown)   SpO2 98%   BMI 31.53 kg/m  BP Readings from Last 3 Encounters:  02/28/23 128/80  12/09/22 127/80  09/08/22 138/80   Wt Readings from Last 3 Encounters:  02/28/23 175 lb 3.2 oz (79.5 kg)  12/09/22 172 lb 4 oz (78.1 kg)  09/08/22 168 lb (76.2 kg)    Physical Exam Vitals reviewed.  Constitutional:      Appearance: She is well-developed.  Eyes:     Conjunctiva/sclera: Conjunctivae normal.  Cardiovascular:     Rate and Rhythm: Normal rate and regular rhythm.     Pulses: Normal pulses.     Heart sounds: Normal heart sounds.  Pulmonary:     Effort: Pulmonary effort is normal.     Breath sounds: Normal breath sounds. No wheezing, rhonchi or rales.  Skin:    General: Skin is warm and dry.  Neurological:     Mental Status: She  is alert.  Psychiatric:        Speech: Speech normal.        Behavior: Behavior normal.        Thought Content: Thought content normal.

## 2023-02-28 NOTE — Patient Instructions (Addendum)
Decrease Zoloft 100 mg to 50 mg daily Start Wellbutrin 150 mg and after 3 to 7 days increase to 300 mg (2 tablets) in the morning We discussed today starting medication called Wellbutrin.  As also discussed, you must limit alcohol on this medication as alcohol and Wellbutrin together may increase your risk for seizure.  You may drink no more than 1 alcoholic beverage on this medication.  A standard drink is 12 ounces of regular beer, which is usually about 5% alcohol OR 5 ounces of wine, which is typically about 12% alcohol OR   1.5 ounces of distilled spirits, which is about 40% alcohol  Please let me know how you are doing

## 2023-02-28 NOTE — Assessment & Plan Note (Signed)
Chronic, stable.  Continue hydrochlorothiazide 12 mg, amlodipine 5 mg

## 2023-02-28 NOTE — Assessment & Plan Note (Signed)
Concern for weight gain or difficulty losing weight on Zoloft.  Decrease Zoloft 50 mg daily.  Patient will start Wellbutrin 150 mg and increase to 300 mg every morning after 3 to 7 days.

## 2023-03-07 ENCOUNTER — Ambulatory Visit: Payer: Managed Care, Other (non HMO) | Admitting: Family

## 2023-05-05 ENCOUNTER — Other Ambulatory Visit: Payer: Self-pay | Admitting: Family

## 2023-05-05 DIAGNOSIS — E669 Obesity, unspecified: Secondary | ICD-10-CM

## 2023-05-25 ENCOUNTER — Other Ambulatory Visit: Payer: Self-pay | Admitting: Family

## 2023-05-25 DIAGNOSIS — E669 Obesity, unspecified: Secondary | ICD-10-CM

## 2023-06-06 DIAGNOSIS — M1711 Unilateral primary osteoarthritis, right knee: Secondary | ICD-10-CM | POA: Insufficient documentation

## 2023-06-06 DIAGNOSIS — M25561 Pain in right knee: Secondary | ICD-10-CM | POA: Insufficient documentation

## 2023-06-07 ENCOUNTER — Ambulatory Visit: Payer: BLUE CROSS/BLUE SHIELD | Attending: Nurse Practitioner | Admitting: Nurse Practitioner

## 2023-06-07 ENCOUNTER — Encounter: Payer: Self-pay | Admitting: Nurse Practitioner

## 2023-06-07 VITALS — BP 128/92 | HR 74 | Ht 62.5 in | Wt 175.8 lb

## 2023-06-07 DIAGNOSIS — Z8249 Family history of ischemic heart disease and other diseases of the circulatory system: Secondary | ICD-10-CM

## 2023-06-07 DIAGNOSIS — I10 Essential (primary) hypertension: Secondary | ICD-10-CM | POA: Diagnosis not present

## 2023-06-07 DIAGNOSIS — E669 Obesity, unspecified: Secondary | ICD-10-CM

## 2023-06-07 DIAGNOSIS — E785 Hyperlipidemia, unspecified: Secondary | ICD-10-CM

## 2023-06-07 NOTE — Progress Notes (Signed)
Office Visit    Patient Name: Deanna Schultz Date of Encounter: 06/07/2023  Primary Care Provider:  Allegra Grana, FNP Primary Cardiologist:  Deanna Lemma, MD  Chief Complaint    52 y.o. y/o female with a family history of CAD, hypertension, hyperlipidemia, obesity, anxiety, irritable bowel syndrome, and lung nodules, who presents for follow-up related to hyperlipidemia.  Past Medical History    Past Medical History:  Diagnosis Date   Anxiety    Family history of premature CAD    a. 05/2022 Cardiac CT: Ca2+ = 0.   GERD (gastroesophageal reflux disease)    Hypertension    IBS (irritable bowel syndrome)    Lung nodules    a. 05/2022 CT Chest: 2, 3mm noncalcified nodules in the right lower lobe and lingula.   Mixed hyperlipidemia    Morbid obesity (HCC)    Placenta previa    Past Surgical History:  Procedure Laterality Date   CESAREAN SECTION     x 1    DILATION AND CURETTAGE OF UTERUS  06/2002   due to miscarriage   VAGINAL DELIVERY     x 2    Allergies  No Known Allergies  History of Present Illness      52 y.o. y/o female with above past medical history including family history of CAD, hypertension, hyperlipidemia, obesity, anxiety, irritable bowel syndrome, and incidental finding of lung nodules.  She previously established with Dr. Herbie Schultz for risk stratification in the setting of her father having CABG in her early 69s, and mother having a stroke in his early 48s.  Coronary calcium scoring was performed in July 2023 with a calcium score of 0.  She was incidentally noted to have 2, 3 mm noncalcified nodules in the right lower lobe and lingula with recommendation for follow-up only if she were high risk.  She is a lifelong non-smoker.    Deanna Schultz was last seen in cardiology clinic in January 2024, at which time she was doing well.  Prior to that visit, she had lipids in December 2023, showing a total cholesterol 323, HDL of 90, LDL 193, triglycerides of  198.  She was placed on rosuvastatin 20 mg daily with follow-up lipids in March showing a total cholesterol of 157, HDL of 81, LDL of 60, and triglycerides of 78.  Today, she reports that she has been feeling well.  She continues to exercise about 3 to 4 days a week, typically about 3 to 4 miles.  She otherwise is active throughout the week without symptoms or limitations.  She denies chest pain, palpitations, dyspnea, pnd, orthopnea, n, v, dizziness, syncope, edema, weight gain, or early satiety.  In the setting of not being able to obtain a GLP-1 agonist through her insurance, she question whether or not phentermine would be reasonable option advised that with her history of hypertension family history of coronary disease, that I would prefer she seek lifestyle modifications.  With a long discussion about the benefits of whole food diet, flexitarian diet, and/or using a wt loss program such as weight watchers.  Home Medications    Current Outpatient Medications  Medication Sig Dispense Refill   amLODipine (NORVASC) 5 MG tablet TAKE 1 TABLET BY MOUTH EVERY DAY 90 tablet 2   b complex vitamins capsule Take 1 capsule by mouth in the morning and at bedtime.     buPROPion (WELLBUTRIN XL) 150 MG 24 hr tablet Start 150 mg ER PO qam, increase after 3 days to  300 mg qam. 180 tablet 3   hydrochlorothiazide (MICROZIDE) 12.5 MG capsule TAKE 1 CAPSULE BY MOUTH EVERY DAY 90 capsule 1   hydrOXYzine (ATARAX) 10 MG tablet TAKE 1 TABLET (10 MG TOTAL) BY MOUTH TWICE A DAY AS NEEDED FOR ANXIETY 180 tablet 1   LINZESS 145 MCG CAPS capsule TAKE 1 CAPSULE BY MOUTH EVERY DAY 90 capsule 3   meloxicam (MOBIC) 7.5 MG tablet TAKE 1 TABLET BY MOUTH DAILY AS NEEDED FOR PAIN (Patient taking differently: Take 7.5 mg by mouth 2 (two) times daily. for pain) 30 tablet 1   metFORMIN (GLUCOPHAGE-XR) 500 MG 24 hr tablet TAKE 2 TABLETS (1,000 MG TOTAL) BY MOUTH 2 (TWO) TIMES DAILY WITH A MEAL. 336 tablet 0   Omeprazole-Sodium Bicarbonate  (ZEGERID) 20-1100 MG CAPS Take 1 capsule by mouth daily before breakfast. 28 each 3   rosuvastatin (CRESTOR) 20 MG tablet Take 1 tablet (20 mg) by mouth once daily 90 tablet 3   sertraline (ZOLOFT) 50 MG tablet Take 1 tablet (50 mg total) by mouth at bedtime. 90 tablet 3   No current facility-administered medications for this visit.     Review of Systems    She denies chest pain, palpitations, dyspnea, pnd, orthopnea, n, v, dizziness, syncope, edema, weight gain, or early satiety.  All other systems reviewed and are otherwise negative except as noted above.    Physical Exam    VS:  BP (!) 128/92 (BP Location: Left Arm, Patient Position: Sitting, Cuff Size: Normal)   Pulse 74   Ht 5' 2.5" (1.588 m)   Wt 175 lb 12.8 oz (79.7 kg)   SpO2 98%   BMI 31.64 kg/m  , BMI Body mass index is 31.64 kg/m.     GEN: Well nourished, well developed, in no acute distress. HEENT: normal. Neck: Supple, no JVD, carotid bruits, or masses. Cardiac: RRR, no murmurs, rubs, or gallops. No clubbing, cyanosis, edema.  Radials 2+/PT 2+ and equal bilaterally.  Respiratory:  Respirations regular and unlabored, clear to auscultation bilaterally. GI: Soft, nontender, nondistended, BS + x 4. MS: no deformity or atrophy. Skin: warm and dry, no rash. Neuro:  Strength and sensation are intact. Psych: Normal affect.  Accessory Clinical Findings    ECG: Regular sinus rhythm, 74, no acute ST or T changes-personally reviewed  Lab Results  Component Value Date   WBC 5.6 05/03/2022   HGB 13.8 05/03/2022   HCT 41.0 05/03/2022   MCV 95.1 05/03/2022   PLT 231.0 05/03/2022   Lab Results  Component Value Date   CREATININE 0.66 05/03/2022   BUN 19 05/03/2022   NA 138 05/03/2022   K 3.6 05/03/2022   CL 101 05/03/2022   CO2 26 05/03/2022   Lab Results  Component Value Date   ALT 29 01/31/2023   AST 33 01/31/2023   ALKPHOS 53 01/31/2023   BILITOT 0.6 01/31/2023   Lab Results  Component Value Date   CHOL  157 01/31/2023   HDL 81 01/31/2023   LDLCALC 60 01/31/2023   LDLDIRECT 124.2 12/05/2013   TRIG 78 01/31/2023   CHOLHDL 1.9 01/31/2023    Lab Results  Component Value Date   HGBA1C 5.6 05/03/2022    Assessment & Plan    1.  Hyperlipidemia: Previous LDL 193 in December 2023.  We initiated rosuvastatin therapy in January of this year and follow-up lipids in March were markedly improved with LDL of 60, total cholesterol 157, HDL of 81, triglycerides of 78.  She is tolerating  rosuvastatin well without symptoms or limitations.  LFTs were normal in March.  2.  Essential hypertension: Diastolic pressure is elevated mildly today with systolic 128.  She remains on amlodipine and HCTZ.  Encouraged ongoing exercise and weight loss.  3.  Family history of premature CAD: Cardiac CT in July 2023 showed a calcium score of 0.  She continues to exercise 3 to 4 days a week without limitations.  Continue statin therapy.  4.  Morbid obesity: Patient has been frustrated with inability to lose weight.  Her insurance would not cover a GLP-1 agonist.  I recommended against phentermine today and instead, encouraged her to consider a more flexitarian versus plant-based diet along with increasing her activity goals to at least 150 minutes a week of moderate exercise.  We also discussed the potential benefits of a program such as weight watchers and she will look into this further.  5.  Disposition: Patient plans to move to [place] in the fall.  She will follow-up as needed.  Informed Consent    Nicolasa Ducking, NP 06/07/2023, 8:16 AM

## 2023-06-07 NOTE — Patient Instructions (Signed)
Medication Instructions:  No changes *If you need a refill on your cardiac medications before your next appointment, please call your pharmacy*   Lab Work: None ordered If you have labs (blood work) drawn today and your tests are completely normal, you will receive your results only by: MyChart Message (if you have MyChart) OR A paper copy in the mail If you have any lab test that is abnormal or we need to change your treatment, we will call you to review the results.   Testing/Procedures: None ordered   Follow-Up: At [facility], you and your health needs are our priority.  As part of our continuing mission to provide you with exceptional heart care, we have created designated Provider Care Teams.  These Care Teams include your primary Cardiologist (physician) and Advanced Practice Providers (APPs -  Physician Assistants and Nurse Practitioners) who all work together to provide you with the care you need, when you need it.  We recommend signing up for the patient portal called "MyChart".  Sign up information is provided on this After Visit Summary.  MyChart is used to connect with patients for Virtual Visits (Telemedicine).  Patients are able to view lab/test results, encounter notes, upcoming appointments, etc.  Non-urgent messages can be sent to your provider as well.   To learn more about what you can do with MyChart, go to [email or web address].    Your next appointment:   Follow up as needed  

## 2023-06-08 ENCOUNTER — Encounter: Payer: Self-pay | Admitting: Obstetrics & Gynecology

## 2023-06-08 ENCOUNTER — Ambulatory Visit (INDEPENDENT_AMBULATORY_CARE_PROVIDER_SITE_OTHER): Payer: BLUE CROSS/BLUE SHIELD | Admitting: Obstetrics & Gynecology

## 2023-06-08 ENCOUNTER — Other Ambulatory Visit (HOSPITAL_COMMUNITY)
Admission: RE | Admit: 2023-06-08 | Discharge: 2023-06-08 | Disposition: A | Discharge status: Home / Self Care | Payer: BLUE CROSS/BLUE SHIELD | Source: Ambulatory Visit | Attending: Obstetrics & Gynecology | Admitting: Obstetrics & Gynecology

## 2023-06-08 VITALS — BP 112/64 | HR 72 | Ht 62.25 in | Wt 176.0 lb

## 2023-06-08 DIAGNOSIS — Z01419 Encounter for gynecological examination (general) (routine) without abnormal findings: Secondary | ICD-10-CM | POA: Insufficient documentation

## 2023-06-08 DIAGNOSIS — Z780 Asymptomatic menopausal state: Secondary | ICD-10-CM

## 2023-06-08 DIAGNOSIS — Z9189 Other specified personal risk factors, not elsewhere classified: Secondary | ICD-10-CM

## 2023-06-08 NOTE — Progress Notes (Signed)
Deanna Schultz [birth year] [id number]   History:    52 y.o. [id number] Married.  Lost her mother 11/2018. Husband is a retired Emergency planning/management officer, now working with brother in [employer].  Vasectomy. Son 63 yo working and married, daughters 43 and 81 yo.   RP:  Established patient presenting for annual gyn exam   HPI: Postmenopause, well on no HRT.  No PMB.  No pelvic pain.  No pain with intercourse.  Vasectomy.  Pap 04/2021 Neg.  No h/o abnormal Pap. Pap reflex today. Urine and bowel movements normal. Breasts normal.  Mammo 05/2022 Neg.  BMI 31.93.  Walking daily. Decreasing carbs.  Health labs with family NP.  [facility] 12/2019.   Past medical history,surgical history, family history and social history were all reviewed and documented in the EPIC chart.  Gynecologic History No LMP recorded (lmp unknown). Patient is postmenopausal.  Obstetric History OB History  Gravida Para Term Preterm AB Living  4 3 3   1 3   SAB IAB Ectopic Multiple Live Births  1       3    # Outcome Date GA Lbr Len/2nd Weight Sex Type Anes PTL Lv  4 SAB           3 Term           2 Term           1 Term              ROS: A ROS was performed and pertinent positives and negatives are included in the history. GENERAL: No fevers or chills. HEENT: No change in vision, no earache, sore throat or sinus congestion. NECK: No pain or stiffness. CARDIOVASCULAR: No chest pain or pressure. No palpitations. PULMONARY: No shortness of breath, cough or wheeze. GASTROINTESTINAL: No abdominal pain, nausea, vomiting or diarrhea, melena or bright red blood per rectum. GENITOURINARY: No urinary frequency, urgency, hesitancy or dysuria. MUSCULOSKELETAL: No joint or muscle pain, no back pain, no recent trauma. DERMATOLOGIC: No rash, no itching, no lesions. ENDOCRINE: No polyuria, polydipsia, no heat or cold intolerance. No recent change in weight. HEMATOLOGICAL: No anemia or easy bruising or bleeding. NEUROLOGIC: No headache, seizures,  numbness, tingling or weakness. PSYCHIATRIC: No depression, no loss of interest in normal activity or change in sleep pattern.     Exam:   BP 112/64   Pulse 72   Ht 5' 2.25" (1.581 m)   Wt 176 lb (79.8 kg)   LMP  (LMP Unknown) Comment: sexually active, husband vasectomy  SpO2 98%   BMI 31.93 kg/m   Body mass index is 31.93 kg/m.  General appearance : Well developed well nourished female. No acute distress HEENT: Eyes: no retinal hemorrhage or exudates,  Neck supple, trachea midline, no carotid bruits, no thyroidmegaly Lungs: Clear to auscultation, no rhonchi or wheezes, or rib retractions  Heart: Regular rate and rhythm, no murmurs or gallops Breast:Examined in sitting and supine position were symmetrical in appearance, no palpable masses or tenderness,  no skin retraction, no nipple inversion, no nipple discharge, no skin discoloration, no axillary or supraclavicular lymphadenopathy Abdomen: no palpable masses or tenderness, no rebound or guarding Extremities: no edema or skin discoloration or tenderness  Pelvic: Vulva: Normal             Vagina: No gross lesions or discharge  Cervix: No gross lesions or discharge.  Pap reflex done.  Uterus  AV, normal size, shape and consistency, non-tender and mobile  Adnexa  Without masses or  tenderness  Anus: Normal   Assessment/Plan:  52 y.o. female for annual exam   1. Encounter for routine gynecological examination with Papanicolaou smear of cervix Postmenopause, well on no HRT.  No PMB.  No pelvic pain.  No pain with intercourse.  Vasectomy.  Pap 04/2021 Neg.  No h/o abnormal Pap. Pap reflex today. Urine and bowel movements normal. Breasts normal. Mammo 05/2022 Neg.  BMI 31.93.  Walking daily. Decreasing carbs. Health labs with family NP.  [facility] 12/2019. - Cytology - PAP( [place])  2. Postmenopause Postmenopause, well on no HRT.  No PMB.  No pelvic pain.  No pain with intercourse.  Vasectomy.   3. Relies on partner vasectomy for  contraception   Genia Del MD, 2:34 PM

## 2023-06-09 LAB — CYTOLOGY - PAP: Diagnosis: NEGATIVE

## 2023-06-14 ENCOUNTER — Encounter: Payer: Self-pay | Admitting: Family

## 2023-06-14 NOTE — Telephone Encounter (Signed)
Spoke to pt and she has appt scheduled for 07/03/23 pt stated that she can wait until then because she will be out of town next week

## 2023-06-30 ENCOUNTER — Ambulatory Visit: Payer: Managed Care, Other (non HMO) | Admitting: Family

## 2023-07-03 ENCOUNTER — Encounter: Payer: Self-pay | Admitting: Family

## 2023-07-03 ENCOUNTER — Ambulatory Visit: Payer: BLUE CROSS/BLUE SHIELD | Admitting: Family

## 2023-07-03 VITALS — BP 118/78 | HR 78 | Temp 97.9°F | Ht 62.0 in | Wt 174.8 lb

## 2023-07-03 DIAGNOSIS — F411 Generalized anxiety disorder: Secondary | ICD-10-CM | POA: Diagnosis not present

## 2023-07-03 DIAGNOSIS — I10 Essential (primary) hypertension: Secondary | ICD-10-CM

## 2023-07-03 DIAGNOSIS — E669 Obesity, unspecified: Secondary | ICD-10-CM | POA: Diagnosis not present

## 2023-07-03 DIAGNOSIS — Z8639 Personal history of other endocrine, nutritional and metabolic disease: Secondary | ICD-10-CM

## 2023-07-03 LAB — CBC WITH DIFFERENTIAL/PLATELET
Basophils Absolute: 0 10*3/uL (ref 0.0–0.1)
Basophils Relative: 0.4 % (ref 0.0–3.0)
Eosinophils Absolute: 0.1 10*3/uL (ref 0.0–0.7)
Eosinophils Relative: 1 % (ref 0.0–5.0)
HCT: 41.5 % (ref 36.0–46.0)
Hemoglobin: 13.5 g/dL (ref 12.0–15.0)
Lymphocytes Relative: 34.7 % (ref 12.0–46.0)
Lymphs Abs: 2.3 10*3/uL (ref 0.7–4.0)
MCHC: 32.6 g/dL (ref 30.0–36.0)
MCV: 97 fl (ref 78.0–100.0)
Monocytes Absolute: 0.6 10*3/uL (ref 0.1–1.0)
Monocytes Relative: 8.6 % (ref 3.0–12.0)
Neutro Abs: 3.6 10*3/uL (ref 1.4–7.7)
Neutrophils Relative %: 55.3 % (ref 43.0–77.0)
Platelets: 224 10*3/uL (ref 150.0–400.0)
RBC: 4.28 Mil/uL (ref 3.87–5.11)
RDW: 14.2 % (ref 11.5–15.5)
WBC: 6.5 10*3/uL (ref 4.0–10.5)

## 2023-07-03 LAB — COMPREHENSIVE METABOLIC PANEL
ALT: 28 U/L (ref 0–35)
AST: 29 U/L (ref 0–37)
Albumin: 4.7 g/dL (ref 3.5–5.2)
Alkaline Phosphatase: 53 U/L (ref 39–117)
BUN: 18 mg/dL (ref 6–23)
CO2: 26 mEq/L (ref 19–32)
Calcium: 9.8 mg/dL (ref 8.4–10.5)
Chloride: 103 mEq/L (ref 96–112)
Creatinine, Ser: 0.72 mg/dL (ref 0.40–1.20)
GFR: 95.89 mL/min (ref >60.00)
Glucose, Bld: 86 mg/dL (ref 70–99)
Potassium: 4 mEq/L (ref 3.5–5.1)
Sodium: 139 mEq/L (ref 135–145)
Total Bilirubin: 0.3 mg/dL (ref 0.2–1.2)
Total Protein: 7.9 g/dL (ref 6.0–8.3)

## 2023-07-03 LAB — TSH: TSH: 1.17 u[IU]/mL (ref 0.35–5.50)

## 2023-07-03 LAB — HEMOGLOBIN A1C: Hgb A1c MFr Bld: 5.6 % (ref 4.6–6.5)

## 2023-07-03 LAB — HM MAMMOGRAPHY

## 2023-07-03 LAB — VITAMIN D 25 HYDROXY (VIT D DEFICIENCY, FRACTURES): VITD: 59.01 ng/mL (ref 30.00–100.00)

## 2023-07-03 MED ORDER — PHENTERMINE HCL 37.5 MG PO TABS: ORAL_TABLET | ORAL | 2 refills | Status: DC

## 2023-07-03 MED ORDER — SERTRALINE HCL 100 MG PO TABS
100.0000 mg | ORAL_TABLET | Freq: Every day | ORAL | 3 refills | Status: DC
Start: 2023-07-03 — End: 2024-05-09

## 2023-07-03 NOTE — Progress Notes (Signed)
Assessment & Plan:  History of vitamin D deficiency -     VITAMIN D 25 Hydroxy (Vit-D Deficiency, Fractures)  Essential hypertension, benign Assessment & Plan: Chronic, stable.  Continue hydrochlorothiazide 12 mg, amlodipine 5 mg  Orders: -     CBC with Differential/Platelet -     Comprehensive metabolic panel -     TSH  Obesity (BMI 30-39.9) Assessment & Plan: Trial of phentermine 37.5 mg daily.  We spent a lot of time discussing cardiovascular risk including elevations in blood pressure, heart rate, risk of ASCVD event.  Patient has done well on phentermine and would like to resume medication.  Blood pressure is adequate controlled.  Counseled on side effects of increased anxiety, elevated HR, blood pressure in which would warrant stopping medication immediately.  Follow-up in 6 weeks  Orders: -     Hemoglobin A1c -     Phentermine HCl; Take half tablet daily  PO prior to breakfast.After a week, may increase to full tablet if needed  Dispense: 30 tablet; Refill: 2  GAD (generalized anxiety disorder) Assessment & Plan: Plan to resume Zoloft 100 mg, and wean off of Wellbutrin 300 mg.  Provided instructions on AVS  Orders: -     Sertraline HCl; Take 1 tablet (100 mg total) by mouth daily.  Dispense: 90 tablet; Refill: 3     Return precautions given.   Risks, benefits, and alternatives of the medications and treatment plan prescribed today were discussed, and patient expressed understanding.   Education regarding symptom management and diagnosis given to patient on AVS either electronically or printed.  Return in about 6 weeks (around 08/14/2023).  Rennie Plowman, FNP  Subjective:    Patient ID: Deanna Schultz, female    DOB: 04-14-1971, 52 y.o.   MRN: [id number]  CC: Deanna Schultz is a 52 y.o. female who presents today for follow up.   HPI: Overall feels well today.  No new complaints.  She remains frustrated in regards to weight.  She has not lost significant  weight.  She did not feel her appetite was decreased on Wellbutrin 300 mg daily.  She felt better on Zoloft 100 mg daily.  Decreased zoloft to 50mg  and started wellbutrin 150mg  previously.  Previously Reginal Lutes was expensive.  She has been on phentermine in the past and done well.    Allergies: Patient has no known allergies. Current Outpatient Medications on File Prior to Visit  Medication Sig Dispense Refill   amLODipine (NORVASC) 5 MG tablet TAKE 1 TABLET BY MOUTH EVERY DAY 90 tablet 2   b complex vitamins capsule Take 1 capsule by mouth in the morning and at bedtime.     hydrochlorothiazide (MICROZIDE) 12.5 MG capsule TAKE 1 CAPSULE BY MOUTH EVERY DAY 90 capsule 1   hydrOXYzine (ATARAX) 10 MG tablet TAKE 1 TABLET (10 MG TOTAL) BY MOUTH TWICE A DAY AS NEEDED FOR ANXIETY 180 tablet 1   LINZESS 145 MCG CAPS capsule TAKE 1 CAPSULE BY MOUTH EVERY DAY 90 capsule 3   meloxicam (MOBIC) 7.5 MG tablet TAKE 1 TABLET BY MOUTH DAILY AS NEEDED FOR PAIN (Patient taking differently: Take 7.5 mg by mouth 2 (two) times daily. for pain) 30 tablet 1   metFORMIN (GLUCOPHAGE-XR) 500 MG 24 hr tablet TAKE 2 TABLETS (1,000 MG TOTAL) BY MOUTH 2 (TWO) TIMES DAILY WITH A MEAL. 336 tablet 0   Omeprazole-Sodium Bicarbonate (ZEGERID) 20-1100 MG CAPS Take 1 capsule by mouth daily before breakfast. 28 each 3  rosuvastatin (CRESTOR) 20 MG tablet Take 1 tablet (20 mg) by mouth once daily 90 tablet 3   No current facility-administered medications on file prior to visit.    Review of Systems  Constitutional:  Negative for chills and fever.  Respiratory:  Negative for cough.   Cardiovascular:  Negative for chest pain and palpitations.  Gastrointestinal:  Negative for nausea and vomiting.      Objective:    BP 118/78   Pulse 78   Temp 97.9 F (36.6 C) (Oral)   Ht 5\' 2"  (1.575 m)   Wt 174 lb 12.8 oz (79.3 kg)   LMP  (LMP Unknown) Comment: sexually active, husband vasectomy  SpO2 96%   BMI 31.97 kg/m  BP Readings  from Last 3 Encounters:  07/03/23 118/78  06/08/23 112/64  06/07/23 (!) 128/92   Wt Readings from Last 3 Encounters:  07/03/23 174 lb 12.8 oz (79.3 kg)  06/08/23 176 lb (79.8 kg)  06/07/23 175 lb 12.8 oz (79.7 kg)    Physical Exam Vitals reviewed.  Constitutional:      Appearance: She is well-developed.  Eyes:     Conjunctiva/sclera: Conjunctivae normal.  Cardiovascular:     Rate and Rhythm: Normal rate and regular rhythm.     Pulses: Normal pulses.     Heart sounds: Normal heart sounds.  Pulmonary:     Effort: Pulmonary effort is normal.     Breath sounds: Normal breath sounds. No wheezing, rhonchi or rales.  Skin:    General: Skin is warm and dry.  Neurological:     Mental Status: She is alert.  Psychiatric:        Speech: Speech normal.        Behavior: Behavior normal.        Thought Content: Thought content normal.

## 2023-07-03 NOTE — Assessment & Plan Note (Signed)
Plan to resume Zoloft 100 mg, and wean off of Wellbutrin 300 mg.  Provided instructions on AVS

## 2023-07-03 NOTE — Assessment & Plan Note (Signed)
Trial of phentermine 37.5 mg daily.  We spent a lot of time discussing cardiovascular risk including elevations in blood pressure, heart rate, risk of ASCVD event.  Patient has done well on phentermine and would like to resume medication.  Blood pressure is adequate controlled.  Counseled on side effects of increased anxiety, elevated HR, blood pressure in which would warrant stopping medication immediately.  Follow-up in 6 weeks

## 2023-07-03 NOTE — Assessment & Plan Note (Signed)
Chronic, stable.  Continue hydrochlorothiazide 12 mg, amlodipine 5 mg 

## 2023-07-03 NOTE — Patient Instructions (Addendum)
You may increase Zoloft to 100 mg daily.  To wean off of Wellbutrin as discussed, please start 150 mg daily for 1 week and then stop medication completely.  Please do not start phentermine until you are off of Wellbutrin completely   please stay vigilant in regards to side effects in particular escalations in blood pressure, palpitations, increased anxiety.    We will aim to lose  1 and no more than 2 pounds per week.    If your weight were to plateau as at that point I advised that we would stop phentermine.  Start by taking half tablet or 18.75 mg ( full tablet is 37.5mg )   and then progress if needed after several days to the full tablet .   Phentermine Capsules or Tablets What is this medication? PHENTERMINE (FEN ter meen) promotes weight loss. It works by decreasing appetite. It is often used for a short period of time. Changes to diet and exercise are often combined with this medication. This medicine may be used for other purposes; ask your health care provider or pharmacist if you have questions. COMMON BRAND NAME(S): Adipex-P, Atti-Plex P, Atti-Plex P Spansule, Fastin, Lomaira, Pro-Fast, Pro-Fast HS, Pro-Fast SA, Tara-8 What should I tell my care team before I take this medication? They need to know if you have any of these conditions: Agitation or nervousness Diabetes Glaucoma Heart disease High blood pressure History of substance use disorder History of stroke Kidney disease Lung disease called Primary Pulmonary Hypertension (PPH) Taken an MAOI, such as Carbex, Eldepryl, Marplan, Nardil, or Parnate in last 14 days Taking stimulant medications for attention disorders, weight loss, or to stay awake Thyroid disease An unusual or allergic reaction to phentermine, other medications, foods, dyes, or preservatives Pregnant or trying to get pregnant Breastfeeding How should I use this medication? Take this medication by mouth with a glass of water. Follow the directions on the  prescription label. Take your medication at regular intervals. Do not take it more often than directed. Do not stop taking except on your care team's advice. Talk to your care team about the use of this medication in children. While this medication may be prescribed for children 17 years or older for selected conditions, precautions do apply. Overdosage: If you think you have taken too much of this medicine contact a poison control center or emergency room at once. NOTE: This medicine is only for you. Do not share this medicine with others. What if I miss a dose? If you miss a dose, take it as soon as you can. If it is almost time for your next dose, take only that dose. Do not take double or extra doses. What may interact with this medication? Do not take this medication with any of the following: MAOIs, such as Carbex, Eldepryl, Marplan, Nardil, and Parnate This medication may also interact with the following: Alcohol Certain medications for depression, anxiety, or other mental health conditions Certain medications for blood pressure Linezolid Medications for colds or breathing difficulties, such as pseudoephedrine or phenylephrine Medications for diabetes Sibutramine Stimulant medications for ADHD, weight loss, or staying awake This list may not describe all possible interactions. Give your health care provider a list of all the medicines, herbs, non-prescription drugs, or dietary supplements you use. Also tell them if you smoke, drink alcohol, or use illegal drugs. Some items may interact with your medicine. What should I watch for while using this medication? Visit your care team for regular checks on your progress. Do not  stop taking except on your care team's advice. You may develop a severe reaction. Your care team will tell you how much medication to take. Do not take this medication close to bedtime. It may prevent you from sleeping. This medication may affect your coordination,  reaction time, or judgment. Do not drive or operate machinery until you know how this medication affects you. Sit up or stand slowly to reduce the risk of dizzy or fainting spells. Drinking alcohol with this medication can increase the risk of these side effects. This medication may affect blood sugar levels. Ask your care team if changes in diet or medications are needed if you have diabetes. Inform your care team if you wish to become pregnant or think you might be pregnant. Losing weight while pregnant is not advised and may cause harm to the unborn child. Talk to your care team for more information. What side effects may I notice from receiving this medication? Side effects that you should report to your care team as soon as possible: Allergic reactions--skin rash, itching, hives, swelling of the face, lips, tongue, or throat Heart valve disease--shortness of breath, chest pain, unusual weakness or fatigue, dizziness, feeling faint or lightheaded, fever, sudden weight gain, fast or irregular heartbeat Pulmonary hypertension--shortness of breath, chest pain, fast or irregular heartbeat, feeling faint or lightheaded, fatigue, swelling of the ankles or feet Side effects that usually do not require medical attention (report to your care team if they continue or are bothersome): Change in taste Diarrhea Dizziness Dry mouth Restlessness Trouble sleeping This list may not describe all possible side effects. Call your doctor for medical advice about side effects. You may report side effects to FDA at 1-800-FDA-1088. Where should I keep my medication? Keep out of the reach of children. This medication can be abused. Keep your medication in a safe place to protect it from theft. Do not share this medication with anyone. Selling or giving away this medication is dangerous and against the law. This medication may cause harm and death if it is taken by other adults, children, or pets. Return medication that  has not been used to an official disposal site. Contact the DEA at 617-717-7877 or your city/county government to find a site. If you cannot return the medication, mix any unused medication with a substance like cat litter or coffee grounds. Then throw the medication away in a sealed container like a sealed bag or coffee can with a lid. Do not use the medication after the expiration date. Store at room temperature between 20 and 25 degrees C (68 and 77 degrees F). Keep container tightly closed. NOTE: This sheet is a summary. It may not cover all possible information. If you have questions about this medicine, talk to your doctor, pharmacist, or health care provider.  2023 Elsevier/Gold Standard (2008-01-05 00:00:00)

## 2023-07-04 ENCOUNTER — Encounter: Payer: Self-pay | Admitting: Family

## 2023-07-13 ENCOUNTER — Encounter (INDEPENDENT_AMBULATORY_CARE_PROVIDER_SITE_OTHER): Payer: Self-pay

## 2023-07-14 ENCOUNTER — Other Ambulatory Visit: Payer: Self-pay | Admitting: Oncology

## 2023-07-14 DIAGNOSIS — Z006 Encounter for examination for normal comparison and control in clinical research program: Secondary | ICD-10-CM

## 2023-08-14 ENCOUNTER — Ambulatory Visit: Payer: BLUE CROSS/BLUE SHIELD | Admitting: Family

## 2023-08-14 ENCOUNTER — Encounter: Payer: Self-pay | Admitting: Family

## 2023-08-14 VITALS — BP 124/76 | HR 80 | Temp 97.8°F | Ht 62.0 in | Wt 171.8 lb

## 2023-08-14 DIAGNOSIS — Z23 Encounter for immunization: Secondary | ICD-10-CM

## 2023-08-14 DIAGNOSIS — G6289 Other specified polyneuropathies: Secondary | ICD-10-CM | POA: Diagnosis not present

## 2023-08-14 DIAGNOSIS — E669 Obesity, unspecified: Secondary | ICD-10-CM | POA: Diagnosis not present

## 2023-08-14 DIAGNOSIS — F411 Generalized anxiety disorder: Secondary | ICD-10-CM

## 2023-08-14 DIAGNOSIS — I10 Essential (primary) hypertension: Secondary | ICD-10-CM

## 2023-08-14 MED ORDER — MELOXICAM 7.5 MG PO TABS: 7.5000 mg | ORAL_TABLET | Freq: Every day | ORAL | 1 refills | Status: DC | PRN

## 2023-08-14 MED ORDER — HYDROXYZINE HCL 10 MG PO TABS
10.0000 mg | ORAL_TABLET | Freq: Every evening | ORAL | 0 refills | Status: DC | PRN
Start: 2023-08-14 — End: 2023-08-15

## 2023-08-14 NOTE — Assessment & Plan Note (Signed)
Chronic, stable.  Continue hydrochlorothiazide 12 mg, amlodipine 5 mg

## 2023-08-14 NOTE — Assessment & Plan Note (Signed)
Congratulated patient on weight loss.  Fortunately no adverse effects at this time from phentermine.  Advised her that she may continue with 1/2 tablet and advance to a full tablet if weight loss were to plateau.  Advised patient to follow-up with me in 3 months time

## 2023-08-14 NOTE — Assessment & Plan Note (Signed)
Chronic, stable.  Patient does not want to return to Wellbutrin 300 mg daily.  Continue Zoloft 100 mg

## 2023-08-14 NOTE — Progress Notes (Signed)
Assessment & Plan:  GAD (generalized anxiety disorder) Assessment & Plan: Chronic, stable.  Patient does not want to return to Wellbutrin 300 mg daily.  Continue Zoloft 100 mg  Orders: -     hydrOXYzine HCl; Take 1 tablet (10 mg total) by mouth at bedtime as needed.  Dispense: 90 tablet; Refill: 0  Other polyneuropathy -     Meloxicam; Take 1 tablet (7.5 mg total) by mouth daily as needed for pain.  Dispense: 30 tablet; Refill: 1  Need for influenza vaccination -     Flu Vaccine QUAD 58mo+IM (Fluarix, Fluzone & Alfiuria Quad PF)  Essential hypertension, benign Assessment & Plan: Chronic, stable.  Continue hydrochlorothiazide 12 mg, amlodipine 5 mg   Obesity (BMI 30-39.9) Assessment & Plan: Congratulated patient on weight loss.  Fortunately no adverse effects at this time from phentermine.  Advised her that she may continue with 1/2 tablet and advance to a full tablet if weight loss were to plateau.  Advised patient to follow-up with me in 3 months time      Return precautions given.   Risks, benefits, and alternatives of the medications and treatment plan prescribed today were discussed, and patient expressed understanding.   Education regarding symptom management and diagnosis given to patient on AVS either electronically or printed.  Return in about 4 months (around 12/14/2023).  Rennie Plowman, FNP  Subjective:    Patient ID: Deanna Schultz, female    DOB: 10-Jul-1971, 52 y.o.   MRN: [id number]  CC: JOURNEY LOCH is a 52 y.o. female who presents today for follow up.   HPI: Follow-up  phentermine 37.5 mg daily.  Started 6 weeks ago. Resume Zoloft 100 mg daily.  She is no longer on Wellbutrin 300 mg.  She feels Zoloft 100 mg working well for her.  Denies palpitations, chest pain ,increased anxiety or trouble sleeping on phentermine.   She has been taking 1/2 tablet of phentermine thus far     Allergies: Patient has no known allergies. Current Outpatient  Medications on File Prior to Visit  Medication Sig Dispense Refill   amLODipine (NORVASC) 5 MG tablet TAKE 1 TABLET BY MOUTH EVERY DAY 90 tablet 2   b complex vitamins capsule Take 1 capsule by mouth in the morning and at bedtime.     hydrochlorothiazide (MICROZIDE) 12.5 MG capsule TAKE 1 CAPSULE BY MOUTH EVERY DAY 90 capsule 1   LINZESS 145 MCG CAPS capsule TAKE 1 CAPSULE BY MOUTH EVERY DAY 90 capsule 3   metFORMIN (GLUCOPHAGE-XR) 500 MG 24 hr tablet TAKE 2 TABLETS (1,000 MG TOTAL) BY MOUTH 2 (TWO) TIMES DAILY WITH A MEAL. 336 tablet 0   Omeprazole-Sodium Bicarbonate (ZEGERID) 20-1100 MG CAPS Take 1 capsule by mouth daily before breakfast. 28 each 3   phentermine (ADIPEX-P) 37.5 MG tablet Take half tablet daily  PO prior to breakfast.After a week, may increase to full tablet if needed 30 tablet 2   rosuvastatin (CRESTOR) 20 MG tablet Take 1 tablet (20 mg) by mouth once daily 90 tablet 3   sertraline (ZOLOFT) 100 MG tablet Take 1 tablet (100 mg total) by mouth daily. 90 tablet 3   No current facility-administered medications on file prior to visit.    Review of Systems  Constitutional:  Negative for chills and fever.  Respiratory:  Negative for cough.   Cardiovascular:  Negative for chest pain and palpitations.  Gastrointestinal:  Negative for nausea and vomiting.      Objective:  BP 124/76   Pulse 80   Temp 97.8 F (36.6 C) (Oral)   Ht 5\' 2"  (1.575 m)   Wt 171 lb 12.8 oz (77.9 kg)   LMP  (LMP Unknown) Comment: sexually active, husband vasectomy  SpO2 99%   BMI 31.42 kg/m  BP Readings from Last 3 Encounters:  08/14/23 124/76  07/03/23 118/78  06/08/23 112/64   Wt Readings from Last 3 Encounters:  08/14/23 171 lb 12.8 oz (77.9 kg)  07/03/23 174 lb 12.8 oz (79.3 kg)  06/08/23 176 lb (79.8 kg)    Physical Exam Vitals reviewed.  Constitutional:      Appearance: She is well-developed.  Eyes:     Conjunctiva/sclera: Conjunctivae normal.  Cardiovascular:     Rate and  Rhythm: Normal rate and regular rhythm.     Pulses: Normal pulses.     Heart sounds: Normal heart sounds.  Pulmonary:     Effort: Pulmonary effort is normal.     Breath sounds: Normal breath sounds. No wheezing, rhonchi or rales.  Skin:    General: Skin is warm and dry.  Neurological:     Mental Status: She is alert.  Psychiatric:        Speech: Speech normal.        Behavior: Behavior normal.        Thought Content: Thought content normal.

## 2023-08-14 NOTE — Patient Instructions (Signed)
You may continue phentermine half tablet or advance to full tablet if weight loss were to plateau as discussed Please stay vigilant in regards to palpitations, elevations in blood pressure, heart rate. Please make follow-up with me in the next 3 to 4 months

## 2023-08-15 ENCOUNTER — Other Ambulatory Visit: Payer: Self-pay | Admitting: Family

## 2023-08-15 DIAGNOSIS — F411 Generalized anxiety disorder: Secondary | ICD-10-CM

## 2023-08-15 MED ORDER — HYDROXYZINE HCL 10 MG PO TABS
10.0000 mg | ORAL_TABLET | Freq: Every evening | ORAL | 0 refills | Status: DC | PRN
Start: 2023-08-15 — End: 2023-11-13

## 2023-09-05 ENCOUNTER — Telehealth: Payer: Self-pay | Admitting: Family

## 2023-09-05 MED ORDER — HYDROCHLOROTHIAZIDE 12.5 MG PO CAPS: ORAL_CAPSULE | ORAL | 2 refills | Status: DC

## 2023-09-05 NOTE — Telephone Encounter (Signed)
Patient just called and needs a refill on her medication. [facility] also faxed the prescription. The name is hydrochlorothiazide (MICROZIDE) 12.5 MG capsule. The pharmacy [facility] uses is [id number] in [facility] [address]. Her number is [phone].

## 2023-09-05 NOTE — Telephone Encounter (Signed)
Spoke to pt and informed her that [facility] contact [facility] when ready for pickup

## 2023-10-13 ENCOUNTER — Other Ambulatory Visit: Payer: Self-pay | Admitting: Family

## 2023-10-13 DIAGNOSIS — G6289 Other specified polyneuropathies: Secondary | ICD-10-CM

## 2023-10-17 ENCOUNTER — Other Ambulatory Visit: Payer: Self-pay | Admitting: Family

## 2023-10-17 DIAGNOSIS — E669 Obesity, unspecified: Secondary | ICD-10-CM

## 2023-11-09 ENCOUNTER — Other Ambulatory Visit: Payer: Self-pay | Admitting: Nurse Practitioner

## 2023-11-13 ENCOUNTER — Other Ambulatory Visit: Payer: Self-pay | Admitting: Family

## 2023-11-13 DIAGNOSIS — F411 Generalized anxiety disorder: Secondary | ICD-10-CM

## 2023-12-14 ENCOUNTER — Ambulatory Visit: Payer: BLUE CROSS/BLUE SHIELD | Admitting: Family

## 2023-12-14 ENCOUNTER — Encounter: Payer: Self-pay | Admitting: Family

## 2023-12-14 VITALS — BP 122/76 | HR 83 | Temp 97.7°F | Ht 62.0 in | Wt 177.0 lb

## 2023-12-14 DIAGNOSIS — E669 Obesity, unspecified: Secondary | ICD-10-CM

## 2023-12-14 DIAGNOSIS — Z6832 Body mass index (BMI) 32.0-32.9, adult: Secondary | ICD-10-CM

## 2023-12-14 DIAGNOSIS — I10 Essential (primary) hypertension: Secondary | ICD-10-CM | POA: Diagnosis not present

## 2023-12-14 NOTE — Progress Notes (Signed)
Assessment & Plan:  Essential hypertension, benign Assessment & Plan: Chronic, stable.  Continue hydrochlorothiazide 12 mg, amlodipine 5 mg   Obesity (BMI 30-39.9) Assessment & Plan: Long discussion as it relates to cardiovascular risk of phentermine.  We discussed risk and benefit of medication.  Patient has not had significant weight loss however she has also had intermittent use of phentermine and has been using half tablet.  Advised her that I would refill medication today as long as she consistently lost 1 to 2 pounds per week while on medication, it is reasonable to continue and complete 53-month course.  If, however, she does not lose weight on medication consistently the first couple weeks, advised her that risk exceeds the benefit and she may stop medication in its entirety.  She will let me know how she is doing  Orders: -     Insulin, random     Return precautions given.   Risks, benefits, and alternatives of the medications and treatment plan prescribed today were discussed, and patient expressed understanding.   Education regarding symptom management and diagnosis given to patient on AVS either electronically or printed.  No follow-ups on file.  Rennie Plowman, FNP  Subjective:    Patient ID: Deanna Schultz, female    DOB: August 11, 1971, 53 y.o.   MRN: [id number]  CC: Deanna Schultz is a 53 y.o. female who presents today for follow up.   HPI: She has lost 4lbs on phentermine.  She has gained weight over the holidays due to indiscretion.   She reports she has been taking half tablet of phentermine and taking intermittently.  She would only pick up prescription when she was in [place].  She may go weeks without the medication.  Walking 4 days per week.  Denies increased blood pressure, palpitations, and anxiety or trouble sleeping  She would like to continue on phentermine as she felt chronic with medication and felt it had been helping  her     Completed phentermine 06/2023, 08/2023 and 10/2023  Allergies: Patient has no known allergies. Current Outpatient Medications on File Prior to Visit  Medication Sig Dispense Refill   amLODipine (NORVASC) 5 MG tablet TAKE 1 TABLET BY MOUTH EVERY DAY 90 tablet 2   b complex vitamins capsule Take 1 capsule by mouth in the morning and at bedtime.     hydrochlorothiazide (MICROZIDE) 12.5 MG capsule Take one 12.5 mg capsule by mouth daily 90 capsule 2   hydrOXYzine (ATARAX) 10 MG tablet TAKE 1 TABLET BY MOUTH AT BEDTIME AS NEEDED. 90 tablet 0   LINZESS 145 MCG CAPS capsule TAKE 1 CAPSULE BY MOUTH EVERY DAY 90 capsule 3   meloxicam (MOBIC) 7.5 MG tablet Take 1 tablet (7.5 mg total) by mouth daily as needed (Do not take daily). for pain 30 tablet 1   Omeprazole-Sodium Bicarbonate (ZEGERID) 20-1100 MG CAPS Take 1 capsule by mouth daily before breakfast. 28 each 3   phentermine (ADIPEX-P) 37.5 MG tablet Take half tablet daily  PO prior to breakfast.After a week, may increase to full tablet if needed 30 tablet 2   rosuvastatin (CRESTOR) 20 MG tablet TAKE 1 TABLET BY MOUTH EVERY DAY 90 tablet 3   sertraline (ZOLOFT) 100 MG tablet Take 1 tablet (100 mg total) by mouth daily. 90 tablet 3   No current facility-administered medications on file prior to visit.    Review of Systems  Constitutional:  Negative for chills and fever.  Respiratory:  Negative for cough.  Cardiovascular:  Negative for chest pain and palpitations.  Gastrointestinal:  Negative for nausea and vomiting.      Objective:    BP 122/76   Pulse 83   Temp 97.7 F (36.5 C) (Oral)   Ht 5\' 2"  (1.575 m)   Wt 177 lb (80.3 kg)   LMP  (LMP Unknown) Comment: sexually active, husband vasectomy  SpO2 97%   BMI 32.37 kg/m  BP Readings from Last 3 Encounters:  12/14/23 122/76  08/14/23 124/76  07/03/23 118/78   Wt Readings from Last 3 Encounters:  12/14/23 177 lb (80.3 kg)  08/14/23 171 lb 12.8 oz (77.9 kg)  07/03/23 174  lb 12.8 oz (79.3 kg)    Physical Exam Vitals reviewed.  Constitutional:      Appearance: She is well-developed.  Eyes:     Conjunctiva/sclera: Conjunctivae normal.  Cardiovascular:     Rate and Rhythm: Normal rate and regular rhythm.     Pulses: Normal pulses.     Heart sounds: Normal heart sounds.  Pulmonary:     Effort: Pulmonary effort is normal.     Breath sounds: Normal breath sounds. No wheezing, rhonchi or rales.  Skin:    General: Skin is warm and dry.  Neurological:     Mental Status: She is alert.  Psychiatric:        Speech: Speech normal.        Behavior: Behavior normal.        Thought Content: Thought content normal.

## 2023-12-15 ENCOUNTER — Encounter: Payer: Self-pay | Admitting: Family

## 2023-12-15 LAB — INSULIN, RANDOM: Insulin: 15.9 u[IU]/mL

## 2023-12-15 NOTE — Assessment & Plan Note (Signed)
Long discussion as it relates to cardiovascular risk of phentermine.  We discussed risk and benefit of medication.  Patient has not had significant weight loss however she has also had intermittent use of phentermine and has been using half tablet.  Advised her that I would refill medication today as long as she consistently lost 1 to 2 pounds per week while on medication, it is reasonable to continue and complete 82-month course.  If, however, she does not lose weight on medication consistently the first couple weeks, advised her that risk exceeds the benefit and she may stop medication in its entirety.  She will let me know how she is doing

## 2023-12-15 NOTE — Patient Instructions (Signed)
As discussed, I want to ensure that you are safe on phentermine.  If you are not losing weight on this medication, the risk far outweighs the benefit.  The goal is to lose 1 to 2 pounds per week while on medication, if this is not the case, please stop the medication and let me know

## 2023-12-15 NOTE — Assessment & Plan Note (Signed)
Chronic, stable.  Continue hydrochlorothiazide 12 mg, amlodipine 5 mg

## 2023-12-17 ENCOUNTER — Other Ambulatory Visit: Payer: Self-pay | Admitting: Family

## 2023-12-17 DIAGNOSIS — E669 Obesity, unspecified: Secondary | ICD-10-CM

## 2023-12-17 NOTE — Telephone Encounter (Signed)
LOV: 12/14/23  NOV: 02/06/24

## 2024-02-06 ENCOUNTER — Ambulatory Visit: Payer: BLUE CROSS/BLUE SHIELD | Admitting: Family

## 2024-02-14 ENCOUNTER — Encounter: Payer: Self-pay | Admitting: Family

## 2024-02-14 ENCOUNTER — Ambulatory Visit: Payer: BLUE CROSS/BLUE SHIELD | Admitting: Family

## 2024-02-14 VITALS — BP 126/70 | HR 63 | Temp 97.6°F | Ht 62.0 in | Wt 165.8 lb

## 2024-02-14 DIAGNOSIS — I10 Essential (primary) hypertension: Secondary | ICD-10-CM

## 2024-02-14 DIAGNOSIS — F411 Generalized anxiety disorder: Secondary | ICD-10-CM | POA: Diagnosis not present

## 2024-02-14 DIAGNOSIS — E669 Obesity, unspecified: Secondary | ICD-10-CM | POA: Diagnosis not present

## 2024-02-14 NOTE — Assessment & Plan Note (Signed)
 Chronic, stable. Continue Zoloft 100 mg, prn use of atarax

## 2024-02-14 NOTE — Progress Notes (Signed)
 Assessment & Plan:  Essential hypertension, benign Assessment & Plan: Chronic, stable.  Continue hydrochlorothiazide 12 mg, amlodipine 5 mg   Obesity (BMI 30-39.9) Assessment & Plan: Congratulated patient on weight loss, lifestyle changes.  She has 1 more month of phentermine which she plans to complete.  This will complete 93-month course of phentermine.  Advised would not refill phentermine due to cardiovascular risk   GAD (generalized anxiety disorder) Assessment & Plan: Chronic, stable. Continue Zoloft 100 mg, prn use of atarax      Return precautions given.   Risks, benefits, and alternatives of the medications and treatment plan prescribed today were discussed, and patient expressed understanding.   Education regarding symptom management and diagnosis given to patient on AVS either electronically or printed.  Return in about 6 months (around 08/16/2024).  Rennie Plowman, FNP  Subjective:    Patient ID: Deanna Schultz, female    DOB: [birth year], 53 y.o.   MRN: [id number]  CC: Deanna Schultz is a 53 y.o. female who presents today for follow up.   HPI: Feels well todatu. No new concerns She has lost 12 -14lbs with phentermine and weight watchers.  She has felt well on phentermine.  Denies chest pain, palpitations.  She remains compliant with amlodipine, hydrochlorothiazide for hypertension.    Allergies: Patient has no known allergies. Current Outpatient Medications on File Prior to Visit  Medication Sig Dispense Refill   amLODipine (NORVASC) 5 MG tablet TAKE 1 TABLET BY MOUTH EVERY DAY 90 tablet 2   b complex vitamins capsule Take 1 capsule by mouth in the morning and at bedtime.     hydrochlorothiazide (MICROZIDE) 12.5 MG capsule Take one 12.5 mg capsule by mouth daily 90 capsule 2   hydrOXYzine (ATARAX) 10 MG tablet TAKE 1 TABLET BY MOUTH AT BEDTIME AS NEEDED. 90 tablet 0   LINZESS 145 MCG CAPS capsule TAKE 1 CAPSULE BY MOUTH EVERY DAY 90 capsule 3    meloxicam (MOBIC) 7.5 MG tablet Take 1 tablet (7.5 mg total) by mouth daily as needed (Do not take daily). for pain 30 tablet 1   Omeprazole-Sodium Bicarbonate (ZEGERID) 20-1100 MG CAPS Take 1 capsule by mouth daily before breakfast. 28 each 3   phentermine (ADIPEX-P) 37.5 MG tablet TAKE HALF TABLET DAILY BY MOUTH PRIOR TO BREAKFAST.IN 1 WEEK, MAY INCREASE TO FULL TABLET IF NEEDED 30 tablet 2   rosuvastatin (CRESTOR) 20 MG tablet TAKE 1 TABLET BY MOUTH EVERY DAY 90 tablet 3   sertraline (ZOLOFT) 100 MG tablet Take 1 tablet (100 mg total) by mouth daily. 90 tablet 3   No current facility-administered medications on file prior to visit.    Review of Systems  Constitutional:  Negative for chills and fever.  Respiratory:  Negative for cough.   Cardiovascular:  Negative for chest pain and palpitations.  Gastrointestinal:  Negative for nausea and vomiting.      Objective:    BP 126/70   Pulse 63   Temp 97.6 F (36.4 C) (Oral)   Ht 5\' 2"  (1.575 m)   Wt 165 lb 12.8 oz (75.2 kg)   LMP  (LMP Unknown) Comment: sexually active, husband vasectomy  SpO2 99%   BMI 30.33 kg/m  BP Readings from Last 3 Encounters:  02/14/24 126/70  12/14/23 122/76  08/14/23 124/76   Wt Readings from Last 3 Encounters:  02/14/24 165 lb 12.8 oz (75.2 kg)  12/14/23 177 lb (80.3 kg)  08/14/23 171 lb 12.8 oz (77.9 kg)  Physical Exam Vitals reviewed.  Constitutional:      Appearance: She is well-developed.  Eyes:     Conjunctiva/sclera: Conjunctivae normal.  Cardiovascular:     Rate and Rhythm: Normal rate and regular rhythm.     Pulses: Normal pulses.     Heart sounds: Normal heart sounds.  Pulmonary:     Effort: Pulmonary effort is normal.     Breath sounds: Normal breath sounds. No wheezing, rhonchi or rales.  Skin:    General: Skin is warm and dry.  Neurological:     Mental Status: She is alert.  Psychiatric:        Speech: Speech normal.        Behavior: Behavior normal.        Thought  Content: Thought content normal.

## 2024-02-14 NOTE — Assessment & Plan Note (Signed)
 Congratulated patient on weight loss, lifestyle changes.  She has 1 more month of phentermine which she plans to complete.  This will complete 4-month course of phentermine.  Advised would not refill phentermine due to cardiovascular risk

## 2024-02-14 NOTE — Assessment & Plan Note (Signed)
Chronic, stable.  Continue hydrochlorothiazide 12 mg, amlodipine 5 mg

## 2024-03-28 ENCOUNTER — Other Ambulatory Visit: Payer: Self-pay | Admitting: Family

## 2024-03-28 DIAGNOSIS — G6289 Other specified polyneuropathies: Secondary | ICD-10-CM

## 2024-05-04 ENCOUNTER — Other Ambulatory Visit: Payer: Self-pay | Admitting: Family

## 2024-05-05 ENCOUNTER — Other Ambulatory Visit: Payer: Self-pay | Admitting: Family

## 2024-05-05 DIAGNOSIS — F411 Generalized anxiety disorder: Secondary | ICD-10-CM

## 2024-05-08 ENCOUNTER — Encounter: Payer: Self-pay | Admitting: Family

## 2024-05-09 ENCOUNTER — Other Ambulatory Visit: Payer: Self-pay | Admitting: Family

## 2024-05-09 DIAGNOSIS — F411 Generalized anxiety disorder: Secondary | ICD-10-CM

## 2024-05-21 ENCOUNTER — Other Ambulatory Visit

## 2024-05-24 ENCOUNTER — Other Ambulatory Visit: Payer: Self-pay | Admitting: Family

## 2024-05-26 ENCOUNTER — Encounter: Payer: Self-pay | Admitting: Family

## 2024-05-26 ENCOUNTER — Other Ambulatory Visit: Payer: Self-pay | Admitting: Family

## 2024-05-27 ENCOUNTER — Telehealth: Payer: Self-pay | Admitting: Family

## 2024-05-27 DIAGNOSIS — I10 Essential (primary) hypertension: Secondary | ICD-10-CM

## 2024-05-27 DIAGNOSIS — Z8639 Personal history of other endocrine, nutritional and metabolic disease: Secondary | ICD-10-CM

## 2024-05-27 DIAGNOSIS — E669 Obesity, unspecified: Secondary | ICD-10-CM

## 2024-05-27 NOTE — Telephone Encounter (Signed)
 Patient need lab orders please

## 2024-06-03 ENCOUNTER — Other Ambulatory Visit (HOSPITAL_COMMUNITY): Payer: Self-pay

## 2024-06-03 ENCOUNTER — Telehealth: Payer: Self-pay

## 2024-06-03 NOTE — Addendum Note (Signed)
 Addended by: Chrisma Hurlock on: 06/03/2024 03:19 PM   Modules accepted: Orders

## 2024-06-03 NOTE — Telephone Encounter (Signed)
 Pharmacy Patient Advocate Encounter   Received notification from Patient Advice Request messages that prior authorization for Linzess  capsules is required/requested.   Insurance verification completed.   The patient is insured through [facility] .   Per test claim: PA required; PA submitted to above mentioned insurance via CoverMyMeds Key/confirmation #/EOC [facility] Status is pending

## 2024-06-03 NOTE — Telephone Encounter (Signed)
 Put in Lab orders for appt on 06/06/24 per PCP

## 2024-06-04 ENCOUNTER — Other Ambulatory Visit (HOSPITAL_COMMUNITY): Payer: Self-pay

## 2024-06-04 NOTE — Telephone Encounter (Signed)
 Pharmacy Patient Advocate Encounter  Received notification from [employer] that Prior Authorization for Linzess  capsules  has been APPROVED from 06/03/24 to 06/03/25. Ran test claim, Copay is $35. This test claim was processed through [facility]- copay amounts may vary at other pharmacies due to pharmacy/plan contracts, or as the patient moves through the different stages of their insurance plan.   PA #/Case ID/Reference #:  [id number]

## 2024-06-05 NOTE — Telephone Encounter (Signed)
 Pt has been notified.

## 2024-06-06 ENCOUNTER — Other Ambulatory Visit

## 2024-06-06 DIAGNOSIS — I10 Essential (primary) hypertension: Secondary | ICD-10-CM | POA: Diagnosis not present

## 2024-06-06 DIAGNOSIS — E669 Obesity, unspecified: Secondary | ICD-10-CM | POA: Diagnosis not present

## 2024-06-06 DIAGNOSIS — Z8639 Personal history of other endocrine, nutritional and metabolic disease: Secondary | ICD-10-CM

## 2024-06-06 LAB — CBC WITH DIFFERENTIAL/PLATELET
Basophils Absolute: 0 K/uL (ref 0.0–0.1)
Basophils Relative: 0.3 % (ref 0.0–3.0)
Eosinophils Absolute: 0.1 K/uL (ref 0.0–0.7)
Eosinophils Relative: 1.4 % (ref 0.0–5.0)
HCT: 40.1 % (ref 36.0–46.0)
Hemoglobin: 13.4 g/dL (ref 12.0–15.0)
Lymphocytes Relative: 37 % (ref 12.0–46.0)
Lymphs Abs: 1.9 K/uL (ref 0.7–4.0)
MCHC: 33.5 g/dL (ref 30.0–36.0)
MCV: 94 fl (ref 78.0–100.0)
Monocytes Absolute: 0.5 K/uL (ref 0.1–1.0)
Monocytes Relative: 9.9 % (ref 3.0–12.0)
Neutro Abs: 2.6 K/uL (ref 1.4–7.7)
Neutrophils Relative %: 51.4 % (ref 43.0–77.0)
Platelets: 223 K/uL (ref 150.0–400.0)
RBC: 4.26 Mil/uL (ref 3.87–5.11)
RDW: 13.4 % (ref 11.5–15.5)
WBC: 5 K/uL (ref 4.0–10.5)

## 2024-06-06 LAB — HEMOGLOBIN A1C: Hgb A1c MFr Bld: 5.8 % (ref 4.6–6.5)

## 2024-06-06 LAB — COMPREHENSIVE METABOLIC PANEL WITH GFR
ALT: 26 U/L (ref 0–35)
AST: 25 U/L (ref 0–37)
Albumin: 4.6 g/dL (ref 3.5–5.2)
Alkaline Phosphatase: 62 U/L (ref 39–117)
BUN: 14 mg/dL (ref 6–23)
CO2: 31 meq/L (ref 19–32)
Calcium: 9.4 mg/dL (ref 8.4–10.5)
Chloride: 100 meq/L (ref 96–112)
Creatinine, Ser: 0.7 mg/dL (ref 0.40–1.20)
GFR: 98.54 mL/min (ref >60.00)
Glucose, Bld: 87 mg/dL (ref 70–99)
Potassium: 3.9 meq/L (ref 3.5–5.1)
Sodium: 136 meq/L (ref 135–145)
Total Bilirubin: 0.5 mg/dL (ref 0.2–1.2)
Total Protein: 7.5 g/dL (ref 6.0–8.3)

## 2024-06-06 LAB — TSH: TSH: 0.82 u[IU]/mL (ref 0.35–5.50)

## 2024-06-06 LAB — VITAMIN D 25 HYDROXY (VIT D DEFICIENCY, FRACTURES): VITD: 37.35 ng/mL (ref 30.00–100.00)

## 2024-06-06 NOTE — Addendum Note (Signed)
 Addended by: TANDA HARVEY D on: 06/06/2024 07:39 AM   Modules accepted: Orders

## 2024-06-07 ENCOUNTER — Ambulatory Visit: Payer: Self-pay | Admitting: Family

## 2024-06-14 ENCOUNTER — Encounter: Payer: Self-pay | Admitting: Family

## 2024-06-14 ENCOUNTER — Ambulatory Visit (INDEPENDENT_AMBULATORY_CARE_PROVIDER_SITE_OTHER): Admitting: Family

## 2024-06-14 ENCOUNTER — Other Ambulatory Visit: Payer: Self-pay | Admitting: Family

## 2024-06-14 VITALS — BP 142/88 | HR 74 | Temp 98.0°F | Resp 20 | Ht 62.0 in | Wt 165.4 lb

## 2024-06-14 DIAGNOSIS — R7303 Prediabetes: Secondary | ICD-10-CM

## 2024-06-14 DIAGNOSIS — E669 Obesity, unspecified: Secondary | ICD-10-CM

## 2024-06-14 DIAGNOSIS — I10 Essential (primary) hypertension: Secondary | ICD-10-CM | POA: Diagnosis not present

## 2024-06-14 DIAGNOSIS — G6289 Other specified polyneuropathies: Secondary | ICD-10-CM

## 2024-06-14 MED ORDER — WEGOVY 0.25 MG/0.5ML ~~LOC~~ SOAJ: 0.2500 mg | SUBCUTANEOUS | 2 refills | Status: DC

## 2024-06-14 MED ORDER — MELOXICAM 7.5 MG PO TABS: 7.5000 mg | ORAL_TABLET | Freq: Every day | ORAL | 1 refills | Status: DC | PRN

## 2024-06-14 MED ORDER — AMLODIPINE BESYLATE 2.5 MG PO TABS: 2.5000 mg | ORAL_TABLET | Freq: Every day | ORAL | 0 refills | Status: DC | PRN

## 2024-06-14 NOTE — Assessment & Plan Note (Signed)
 Discussed preDM and family h/o DM. Counseled on MOA, AE, titration and black box warning as it r/t MEN, MTC, and pancreatitis.  Start wegovy . If not approved by insurance, we will start metformin , titrate.

## 2024-06-14 NOTE — Assessment & Plan Note (Signed)
 Blood pressure elevated today. Discussed stress. She has BP machine at home. She is back and forth to [place]. Advised to monitor BP at home and to take additional amlodipine  2.5mg  if bp > 140/80. Continue amlodipine  5mg , hydrochlorothiazide  12.5mg .

## 2024-06-14 NOTE — Patient Instructions (Addendum)
 Monitor blood pressure at home and me 5-6 reading on separate days. Goal is less than 120/80, based on newest guidelines, however we certainly want to be less than 130/80;  if persistently higher, please make sooner follow up appointment so we can recheck you blood pressure and manage/ adjust medications.  We have discussed starting non insulin  daily injectable medication called Wegovy   which is a glucagon like peptide (GLP 1) agonist and works by delaying gastric emptying and increasing insulin  secretion.It is given once per week. Most patients see significant weight loss with this drug class.   You may NOT take either medication if you or your family has history of thyroid , parathyroid, OR adrenal cancer. Please confirm you and your family does NOT have this history as this drug class has black box warning on this medication for that reason.   Please follow  directions on prescription and slowly increase from 0.25mg  [place] once per week ;stay here for 4 weeks. You may then increase to 0.5mg  [place] once per week and stay there for 4 weeks.  We can slowly titrate further at follow up with goal of no more than 1-2 lbs weight loss per week.  Semaglutide  (Wegovy )  Dose (mg) Once Weekly Titration:    If a dose is not tolerated, consider delaying further dose increases for  another 4 weeks.  If you are actively losing weight on a dose, do not increase medication.   Weeks 1 through 4  0.25 mg once weekly  Weeks 5 through 8  0.5 mg once weekly  Weeks 9 though 12  1 mg once weekly  Weeks 13 through 16  1.7 mg once weekly   Semaglutide  Injection (Weight Management) What is this medication? SEMAGLUTIDE  (SEM a GLOO tide) promotes weight loss. It may also be used to maintain weight loss. It works by decreasing appetite. Changes to diet and exercise are often combined with this medication. This medicine may be used for other purposes; ask your health care provider or pharmacist if you have  questions. COMMON BRAND NAME(S): Wegovy  What should I tell my care team before I take this medication? They need to know if you have any of these conditions: Endocrine tumors (MEN 2) or if someone in your family had these tumors Eye disease, vision problems Gallbladder disease History of depression or mental health disease History of pancreatitis Kidney disease Stomach or intestine problems Suicidal thoughts, plans, or attempt; a previous suicide attempt by you or a family member Thyroid  cancer or if someone in your family had thyroid  cancer An unusual or allergic reaction to semaglutide , other medications, foods, dyes, or preservatives Pregnant or trying to get pregnant Breast-feeding How should I use this medication? This medication is injected under the skin. You will be taught how to prepare and give it. Take it as directed on the prescription label. It is given once every week (every 7 days). Keep taking it unless your care team tells you to stop. It is important that you put your used needles and pens in a special sharps container. Do not put them in a trash can. If you do not have a sharps container, call your pharmacist or care team to get one. A special MedGuide will be given to you by the pharmacist with each prescription and refill. Be sure to read this information carefully each time. This medication comes with INSTRUCTIONS FOR USE. Ask your pharmacist for directions on how to use this medication. Read the information carefully. Talk to your pharmacist  or care team if you have questions. Talk to your care team about the use of this medication in children. While it may be prescribed for children as young as 12 years for selected conditions, precautions do apply. Overdosage: If you think you have taken too much of this medicine contact a poison control center or emergency room at once. NOTE: This medicine is only for you. Do not share this medicine with others. What if I miss a  dose? If you miss a dose and the next scheduled dose is more than 2 days away, take the missed dose as soon as possible. If you miss a dose and the next scheduled dose is less than 2 days away, do not take the missed dose. Take the next dose at your regular time. Do not take double or extra doses. If you miss your dose for 2 weeks or more, take the next dose at your regular time or call your care team to talk about how to restart this medication. What may interact with this medication? Insulin  and other medications for diabetes This list may not describe all possible interactions. Give your health care provider a list of all the medicines, herbs, non-prescription drugs, or dietary supplements you use. Also tell them if you smoke, drink alcohol, or use illegal drugs. Some items may interact with your medicine. What should I watch for while using this medication? Visit your care team for regular checks on your progress. It may be some time before you see the benefit from this medication. Drink plenty of fluids while taking this medication. Check with your care team if you have severe diarrhea, nausea, and vomiting, or if you sweat a lot. The loss of too much body fluid may make it dangerous for you to take this medication. This medication may affect blood sugar levels. Ask your care team if changes in diet or medications are needed if you have diabetes. If you or your family notice any changes in your behavior, such as new or worsening depression, thoughts of harming yourself, anxiety, other unusual or disturbing thoughts, or memory loss, call your care team right away. Women should inform their care team if they wish to become pregnant or think they might be pregnant. Losing weight while pregnant is not advised and may cause harm to the unborn child. Talk to your care team for more information. What side effects may I notice from receiving this medication? Side effects that you should report to your care  team as soon as possible: Allergic reactions--skin rash, itching, hives, swelling of the face, lips, tongue, or throat Change in vision Dehydration--increased thirst, dry mouth, feeling faint or lightheaded, headache, dark yellow or brown urine Gallbladder problems--severe stomach pain, nausea, vomiting, fever Heart palpitations--rapid, pounding, or irregular heartbeat Kidney injury--decrease in the amount of urine, swelling of the ankles, hands, or feet Pancreatitis--severe stomach pain that spreads to your back or gets worse after eating or when touched, fever, nausea, vomiting Thoughts of suicide or self-harm, worsening mood, feelings of depression Thyroid  cancer--new mass or lump in the neck, pain or trouble swallowing, trouble breathing, hoarseness Side effects that usually do not require medical attention (report to your care team if they continue or are bothersome): Diarrhea Loss of appetite Nausea Stomach pain Vomiting This list may not describe all possible side effects. Call your doctor for medical advice about side effects. You may report side effects to FDA at 1-800-FDA-1088. Where should I keep my medication? Keep out of the reach of  children and pets. Refrigeration (preferred): Store in the refrigerator. Do not freeze. Keep this medication in the original container until you are ready to take it. Get rid of any unused medication after the expiration date. Room temperature: If needed, prior to cap removal, the pen can be stored at room temperature for up to 28 days. Protect from light. If it is stored at room temperature, get rid of any unused medication after 28 days or after it expires, whichever is first. It is important to get rid of the medication as soon as you no longer need it or it is expired. You can do this in two ways: Take the medication to a medication take-back program. Check with your pharmacy or law enforcement to find a location. If you cannot return the  medication, follow the directions in the MedGuide. NOTE: This sheet is a summary. It may not cover all possible information. If you have questions about this medicine, talk to your doctor, pharmacist, or health care provider.  2023 Elsevier/Gold Standard (2021-01-28 00:00:00)

## 2024-06-14 NOTE — Progress Notes (Signed)
 Assessment & Plan:  Prediabetes Assessment & Plan: Discussed preDM and family h/o DM. Counseled on MOA, AE, titration and black box warning as it r/t MEN, MTC, and pancreatitis.  Start wegovy . If not approved by insurance, we will start metformin , titrate.   Orders: -     Wegovy ; Inject 0.25 mg into the skin once a week.  Dispense: 2 mL; Refill: 2  Other polyneuropathy -     Meloxicam ; Take 1 tablet (7.5 mg total) by mouth daily as needed (Do not take daily). for pain  Dispense: 30 tablet; Refill: 1  Obesity (BMI 30-39.9) -     Wegovy ; Inject 0.25 mg into the skin once a week.  Dispense: 2 mL; Refill: 2  Essential hypertension, benign Assessment & Plan: Blood pressure elevated today. Discussed stress. She has BP machine at home. She is back and forth to [place]. Advised to monitor BP at home and to take additional amlodipine  2.5mg  if bp > 140/80. Continue amlodipine  5mg , hydrochlorothiazide  12.5mg .    Orders: -     amLODIPine  Besylate; Take 1 tablet (2.5 mg total) by mouth daily as needed. Take 1 tablet po if bp > 140/80.  Dispense: 90 tablet; Refill: 0     Return precautions given.   Risks, benefits, and alternatives of the medications and treatment plan prescribed today were discussed, and patient expressed understanding.   Education regarding symptom management and diagnosis given to patient on AVS either electronically or printed.  No follow-ups on file.  Rollene Northern, FNP  Subjective:    Patient ID: Deanna Schultz, female    DOB: [birth year], 53 y.o.   MRN: [id number]  CC: Deanna Schultz is a 53 y.o. female who presents today for follow up.   HPI: She is concerned with prediabetes She is following with weight watchers.   Tried phentermine  with little results.  She tried metformin  in the past with mixed results.   No personal or family h/o MTC, pancreatitis   She is stressed and selling her home, daughter going to college Compliant with amlodipine  and  hydrochlorothiazide  Denies cp, sob, leg swelling  Family h/o DM, CVA.       Allergies: Patient has no known allergies. Current Outpatient Medications on File Prior to Visit  Medication Sig Dispense Refill   amLODipine  (NORVASC ) 5 MG tablet TAKE 1 TABLET BY MOUTH EVERY DAY 90 tablet 2   b complex vitamins capsule Take 1 capsule by mouth in the morning and at bedtime.     hydrochlorothiazide  (MICROZIDE ) 12.5 MG capsule TAKE ONE 12.5 MG CAPSULE BY MOUTH DAILY 90 capsule 3   hydrOXYzine  (ATARAX ) 10 MG tablet TAKE 1 TABLET BY MOUTH AT BEDTIME AS NEEDED. 90 tablet 0   LINZESS  145 MCG CAPS capsule TAKE 1 CAPSULE BY MOUTH EVERY DAY 90 capsule 1   Omeprazole -Sodium Bicarbonate  (ZEGERID ) 20-1100 MG CAPS Take 1 capsule by mouth daily before breakfast. 28 each 3   rosuvastatin  (CRESTOR ) 20 MG tablet TAKE 1 TABLET BY MOUTH EVERY DAY 90 tablet 3   sertraline  (ZOLOFT ) 100 MG tablet TAKE 1 TABLET BY MOUTH EVERY DAY 90 tablet 3   No current facility-administered medications on file prior to visit.    Review of Systems  Constitutional:  Negative for chills and fever.  Respiratory:  Negative for cough.   Cardiovascular:  Negative for chest pain and palpitations.  Gastrointestinal:  Negative for nausea and vomiting.      Objective:    BP (!) 142/88   Pulse  74   Temp 98 F (36.7 C)   Resp 20   Ht 5' 2 (1.575 m)   Wt 165 lb 6 oz (75 kg)   LMP  (LMP Unknown) Comment: sexually active, husband vasectomy  SpO2 98%   BMI 30.25 kg/m  BP Readings from Last 3 Encounters:  06/14/24 (!) 142/88  02/14/24 126/70  12/14/23 122/76   Wt Readings from Last 3 Encounters:  06/14/24 165 lb 6 oz (75 kg)  02/14/24 165 lb 12.8 oz (75.2 kg)  12/14/23 177 lb (80.3 kg)    Physical Exam Vitals reviewed.  Constitutional:      Appearance: She is well-developed.  Eyes:     Conjunctiva/sclera: Conjunctivae normal.  Neck:     Thyroid : No thyroid  mass, thyromegaly or thyroid  tenderness.  Cardiovascular:      Rate and Rhythm: Normal rate and regular rhythm.     Pulses: Normal pulses.     Heart sounds: Normal heart sounds.  Pulmonary:     Effort: Pulmonary effort is normal.     Breath sounds: Normal breath sounds. No wheezing, rhonchi or rales.  Skin:    General: Skin is warm and dry.  Neurological:     Mental Status: She is alert.  Psychiatric:        Speech: Speech normal.        Behavior: Behavior normal.        Thought Content: Thought content normal.

## 2024-06-20 ENCOUNTER — Encounter: Payer: Self-pay | Admitting: Family

## 2024-06-21 ENCOUNTER — Telehealth: Payer: Self-pay

## 2024-06-21 ENCOUNTER — Other Ambulatory Visit (HOSPITAL_COMMUNITY): Payer: Self-pay

## 2024-06-21 NOTE — Telephone Encounter (Signed)
 Pharmacy Patient Advocate Encounter   Received notification from CoverMyMeds that prior authorization for Wegovy  0.25MG /0.5ML auto-injectors is required/requested.   Insurance verification completed.   The patient is insured through [facility] .   Per test claim: PA required; PA submitted to above mentioned insurance via CoverMyMeds Key/confirmation #/EOC [id number] Status is pending

## 2024-06-24 ENCOUNTER — Other Ambulatory Visit: Payer: Self-pay | Admitting: Family

## 2024-06-24 DIAGNOSIS — R7303 Prediabetes: Secondary | ICD-10-CM

## 2024-06-24 MED ORDER — METFORMIN HCL ER 500 MG PO TB24
500.0000 mg | ORAL_TABLET | Freq: Every evening | ORAL | 2 refills | Status: AC
Start: 2024-06-24 — End: ?

## 2024-06-24 NOTE — Telephone Encounter (Signed)
 Should have come to you. Came to me by mistake.

## 2024-06-26 NOTE — Telephone Encounter (Signed)
 Spoke to pt informed her that Wegovy  was denied,pt was aware

## 2024-06-26 NOTE — Telephone Encounter (Signed)
 Pharmacy Patient Advocate Encounter  Received notification from [employer] that Prior Authorization for Wegovy  0.25MG /0.5ML auto-injectors is required/requested. has been DENIED.  Full denial letter will be uploaded to the media tab. See denial reason below.   The request for coverage for WEGOVY  INJ 0.25MG , use as directed (2ml per month), is denied. This decision is based on health plan criteria for WEGOVY  INJ 0.25MG . This medicine is covered only if: All of the following: (1) Submission of medical records documenting all the following: You have established cardiovascular disease as evidenced by one of the following: (I) Prior myocardial infarction. (II) Prior ischemic or hemorrhagic stroke. (III) Symptomatic peripheral arterial disease evidenced by one of the following: (a) Intermittent claudication with ankle-brachial index less than 0.85 (at rest). (b) Peripheral arterial revascularization procedure. (c) Amputation due to atherosclerotic disease. (2) One of the following: (A) If you have history of myocardial infarction: (I) You are on therapy from each of the following classes unless there is a contraindication or intolerance: (a) Beta blocker (they are, carvedilol, metoprolol, or bisoprolol). (b) Angiotensin-converting enzyme inhibitor, angiotensin II receptor blocker or angiotensin II receptor blocker neprilysin inhibitor. (c) Antiplatelet (for example: aspirin, clopidogrel). (B) If you have history of ischemic or hemorrhagic stroke: (I) You are on therapy from each of the following classes unless there is a contraindication or intolerance: (a) Angiotensin-converting enzyme inhibitor, angiotensin II receptor blocker or angiotensin II receptor blocker neprilysin inhibitor. (b) Antiplatelet (for example: aspirin, clopidogrel). (C) If you have history of symptomatic peripheral artery disease: (I) You are on therapy from each of the following classes unless there is a contraindication  or intolerance: (a) Angiotensin-converting enzyme inhibitor, angiotensin II receptor blocker or angiotensin II receptor blocker neprilysin inhibitor. (b) Antiplatelet (for example: aspirin, clopidogrel). The information provided does not show that you meet the criteria listed above. Reviewed by: Jolaine  PA #/Case ID/Reference #: [id number]

## 2024-07-03 LAB — HM MAMMOGRAPHY

## 2024-07-05 ENCOUNTER — Encounter: Payer: Self-pay | Admitting: Family

## 2024-08-09 ENCOUNTER — Other Ambulatory Visit: Payer: Self-pay | Admitting: Family

## 2024-08-09 DIAGNOSIS — F411 Generalized anxiety disorder: Secondary | ICD-10-CM

## 2024-08-10 ENCOUNTER — Other Ambulatory Visit: Payer: Self-pay | Admitting: Family

## 2024-08-10 DIAGNOSIS — G6289 Other specified polyneuropathies: Secondary | ICD-10-CM

## 2024-09-04 ENCOUNTER — Other Ambulatory Visit: Payer: Self-pay | Admitting: Family

## 2024-09-04 DIAGNOSIS — I10 Essential (primary) hypertension: Secondary | ICD-10-CM

## 2024-09-30 ENCOUNTER — Other Ambulatory Visit: Payer: Self-pay | Admitting: Medical Genetics

## 2024-09-30 DIAGNOSIS — Z006 Encounter for examination for normal comparison and control in clinical research program: Secondary | ICD-10-CM

## 2024-10-30 LAB — GENECONNECT MOLECULAR SCREEN: Genetic Analysis Overall Interpretation: NEGATIVE

## 2024-11-09 ENCOUNTER — Other Ambulatory Visit: Payer: Self-pay | Admitting: Nurse Practitioner

## 2024-11-22 ENCOUNTER — Other Ambulatory Visit: Payer: Self-pay | Admitting: Family

## 2024-11-22 DIAGNOSIS — F411 Generalized anxiety disorder: Secondary | ICD-10-CM

## 2024-11-29 ENCOUNTER — Other Ambulatory Visit: Payer: Self-pay | Admitting: Family

## 2024-12-09 ENCOUNTER — Other Ambulatory Visit: Payer: Self-pay | Admitting: Cardiology

## 2024-12-20 ENCOUNTER — Other Ambulatory Visit: Payer: Self-pay | Admitting: Cardiology
